# Patient Record
Sex: Male | Born: 1955 | Race: White | Hispanic: No | Smoking: Never smoker
Health system: Southern US, Community
[De-identification: ages and names within clinical notes are randomized; demographics above are authoritative.]

## PROBLEM LIST (undated history)

## (undated) DIAGNOSIS — F32A Depression, unspecified: Secondary | ICD-10-CM

## (undated) DIAGNOSIS — F419 Anxiety disorder, unspecified: Secondary | ICD-10-CM

## (undated) DIAGNOSIS — R569 Unspecified convulsions: Secondary | ICD-10-CM

## (undated) DIAGNOSIS — G4700 Insomnia, unspecified: Secondary | ICD-10-CM

## (undated) DIAGNOSIS — F329 Major depressive disorder, single episode, unspecified: Secondary | ICD-10-CM

## (undated) DIAGNOSIS — Z9889 Other specified postprocedural states: Secondary | ICD-10-CM

## (undated) DIAGNOSIS — R112 Nausea with vomiting, unspecified: Secondary | ICD-10-CM

## (undated) DIAGNOSIS — I10 Essential (primary) hypertension: Secondary | ICD-10-CM

## (undated) DIAGNOSIS — S0990XA Unspecified injury of head, initial encounter: Secondary | ICD-10-CM

## (undated) HISTORY — PX: TRACHEOSTOMY: SUR1362

## (undated) HISTORY — PX: FRACTURE SURGERY: SHX138

## (undated) HISTORY — PX: OTHER SURGICAL HISTORY: SHX169

## (undated) HISTORY — PX: BACK SURGERY: SHX140

## (undated) HISTORY — PX: VASECTOMY: SHX75

## (undated) HISTORY — PX: FACIAL FRACTURE SURGERY: SHX1570

---

## 2001-04-20 ENCOUNTER — Emergency Department (HOSPITAL_COMMUNITY): Admission: EM | Admit: 2001-04-20 | Discharge: 2001-04-20 | Payer: Self-pay

## 2002-10-14 ENCOUNTER — Emergency Department (HOSPITAL_COMMUNITY): Admission: EM | Admit: 2002-10-14 | Discharge: 2002-10-14 | Payer: Self-pay | Admitting: Emergency Medicine

## 2005-04-20 ENCOUNTER — Encounter: Admission: RE | Admit: 2005-04-20 | Discharge: 2005-04-20 | Payer: Self-pay | Admitting: Family Medicine

## 2005-05-11 ENCOUNTER — Encounter: Admission: RE | Admit: 2005-05-11 | Discharge: 2005-05-11 | Payer: Self-pay | Admitting: Family Medicine

## 2005-07-17 ENCOUNTER — Emergency Department (HOSPITAL_COMMUNITY): Admission: EM | Admit: 2005-07-17 | Discharge: 2005-07-17 | Payer: Self-pay | Admitting: Emergency Medicine

## 2005-07-19 ENCOUNTER — Encounter: Admission: RE | Admit: 2005-07-19 | Discharge: 2005-07-19 | Payer: Self-pay | Admitting: Internal Medicine

## 2007-01-05 ENCOUNTER — Encounter (INDEPENDENT_AMBULATORY_CARE_PROVIDER_SITE_OTHER): Payer: Self-pay | Admitting: *Deleted

## 2007-01-05 ENCOUNTER — Ambulatory Visit (HOSPITAL_BASED_OUTPATIENT_CLINIC_OR_DEPARTMENT_OTHER): Admission: RE | Admit: 2007-01-05 | Discharge: 2007-01-05 | Payer: Self-pay | Admitting: Urology

## 2007-04-06 ENCOUNTER — Encounter (INDEPENDENT_AMBULATORY_CARE_PROVIDER_SITE_OTHER): Payer: Self-pay | Admitting: Urology

## 2007-04-06 ENCOUNTER — Ambulatory Visit (HOSPITAL_COMMUNITY): Admission: RE | Admit: 2007-04-06 | Discharge: 2007-04-06 | Payer: Self-pay | Admitting: Urology

## 2009-03-23 ENCOUNTER — Observation Stay (HOSPITAL_COMMUNITY): Admission: AD | Admit: 2009-03-23 | Discharge: 2009-03-24 | Payer: Self-pay | Admitting: Internal Medicine

## 2009-06-17 ENCOUNTER — Emergency Department (HOSPITAL_COMMUNITY): Admission: EM | Admit: 2009-06-17 | Discharge: 2009-06-17 | Payer: Self-pay | Admitting: Emergency Medicine

## 2009-11-26 ENCOUNTER — Encounter: Admission: RE | Admit: 2009-11-26 | Discharge: 2009-11-26 | Payer: Self-pay | Admitting: Internal Medicine

## 2011-01-17 LAB — COMPREHENSIVE METABOLIC PANEL
ALT: 16 U/L (ref 0–53)
AST: 21 U/L (ref 0–37)
Albumin: 4.1 g/dL (ref 3.5–5.2)
Alkaline Phosphatase: 175 U/L — ABNORMAL HIGH (ref 39–117)
BUN: 22 mg/dL (ref 6–23)
CO2: 29 mEq/L (ref 19–32)
Calcium: 8.9 mg/dL (ref 8.4–10.5)
Chloride: 105 mEq/L (ref 96–112)
Creatinine, Ser: 0.77 mg/dL (ref 0.4–1.5)
GFR calc Af Amer: >60 mL/min (ref >60)
GFR calc non Af Amer: >60 mL/min (ref >60)
Glucose, Bld: 120 mg/dL — ABNORMAL HIGH (ref 70–99)
Potassium: 3.7 mEq/L (ref 3.5–5.1)
Sodium: 143 mEq/L (ref 135–145)
Total Bilirubin: 0.7 mg/dL (ref 0.3–1.2)
Total Protein: 6.5 g/dL (ref 6.0–8.3)

## 2011-01-17 LAB — CBC
HCT: 37.4 % — ABNORMAL LOW (ref 39.0–52.0)
Hemoglobin: 12.2 g/dL — ABNORMAL LOW (ref 13.0–17.0)
MCHC: 32.6 g/dL (ref 30.0–36.0)
MCV: 77.3 fL — ABNORMAL LOW (ref 78.0–100.0)
Platelets: 187 10*3/uL (ref 150–400)
RBC: 4.84 MIL/uL (ref 4.22–5.81)
RDW: 15.4 % (ref 11.5–15.5)
WBC: 5.7 10*3/uL (ref 4.0–10.5)

## 2011-01-17 LAB — PHENYTOIN LEVEL, TOTAL: Phenytoin Lvl: 7.9 ug/mL — ABNORMAL LOW (ref 10.0–20.0)

## 2011-02-22 NOTE — Op Note (Signed)
NAMEELIC, Richard Hines          ACCOUNT NO.:  [id number]   MEDICAL RECORD NO.:  [id number]          PATIENT TYPE:  AMB   LOCATION:  DAY                          FACILITY:  [facility]   PHYSICIAN:  Sigmund I. Patsi Sears, M.D.DATE OF BIRTH:  05-05-1956   DATE OF PROCEDURE:  04/06/2007  DATE OF DISCHARGE:                               OPERATIVE REPORT   PREOPERATIVE DIAGNOSIS:  Left epididymal cyst, left testicular pain.   POSTOPERATIVE DIAGNOSIS:  Left epididymal cyst, left testicular pain.   PROCEDURE:  Left epididymectomy.   SURGEON:  Sigmund I. Patsi Sears, M.D.   RESIDENT:  Terie Purser, M.D.   ANESTHESIA:  General.   COMPLICATIONS:  None   DISPOSITION:  Stable to the post anesthesia care unit.   SPECIMENS:  Left epididymis for pathologic analysis.   INDICATIONS FOR PROCEDURE:  [name] is a 55 year old gentleman who  has a history of a left sided varicocele.  He has developed left sided  testicular and groin pain.  He has a cyst located on the proximal  portion of the epididymis which is tender to palpation.  He was  counseled regarding benefits and risks of proceeding with a left  epididymectomy for evaluation of the cyst and possible pain control.  The risks included persistence of pain.  He understands these risks and  has agreed to proceed.   DESCRIPTION OF PROCEDURE:  The patient was brought to the operating room  and was properly identified.  A time out was performed to confirm the  correct patient, procedure, and side.  He was administered general  anesthesia and given preoperative antibiotics.  He was placed in the  supine position on the operating table and prepped and draped in the  usual sterile fashion.  On examination, he did have a palpable cystic  structure in the head of the left epididymis.  We made an approximate 4  cm transverse incision in the left hemiscrotum.  Dissection was carried  down through the dartos layer and subcutaneous tissues to expose  the  left testicle.  This was delivered into the operative field.  The tunica  vaginalis was stuck in several areas to the testicle and the epididymis  had areas which appeared to be chronic inflammation.  The cyst was  readily palpable and did not appear to be involving the testis.  The  testis appeared to be free of any abnormality.   We, therefore, proceeded with epididymectomy.  A combination of sharp  dissection and Bovie cautery was used to develop a plane between the  epididymis and testicle.  The epididymis was removed in its entirety.  There was a small opening in the tunica albuginea on the testis at the  insertion site of the epididymis.  This was closed in a running fashion  with 4-0 PDS.  The epididymis was dissected free.  The vas was  identified and the spermatic cord.  This was isolated, divided, and tied  with a 3-0 Vicryl suture.  The vas was dissected down to the epididymis  and the specimen was removed and sent for pathologic analysis.  Hemostasis was ensured by using the Bovie  cautery.  The testis appeared  viable.  We then copiously irrigated the wound.  A cord block with 0.25%  Marcaine was placed.  The testis was then delivered into the hemiscrotum  and we closed the wound.   The dartos layer was closed in a running fashion with 3-0 Vicryl suture.  The skin was closed in a running fashion with 4-0 Vicryl suture.  Dermabond was applied.  Scrotal support and fluff gauze was placed.  The  skin had been infiltrated with 0.25% plain Marcaine.  There were no  complications.  All sponge and needle counts were correct at the end of  the case.  Please note Dr. Patsi Sears was present and participated in  all aspects of this procedure.  The patient was then awakened from  anesthesia and transferred to the recovery room in stable condition.     ______________________________  Terie Purser, MD      Sigmund I. Patsi Sears, M.D.  Electronically Signed    JH/MEDQ  D:   04/06/2007  T:  04/07/2007  Job:  [id number]

## 2011-02-25 NOTE — Op Note (Signed)
Richard Hines, TORTORELLA          ACCOUNT NO.:  [id number]   MEDICAL RECORD NO.:  [id number]          PATIENT TYPE:  AMB   LOCATION:  [facility]                         FACILITY:  [facility]   PHYSICIAN:  Cornelious Bryant, MD     DATE OF BIRTH:  Apr 25, 1956   DATE OF PROCEDURE:  01/05/2007  DATE OF DISCHARGE:                               OPERATIVE REPORT   ATTENDING PHYSICIAN:  Dr. Jethro Bolus.   PREOP DIAGNOSIS:  Left varicocele.   POSTOP DIAGNOSIS:  Left varicocele.   PROCEDURES PERFORMED:  Left internal spermatic vein complex ligation.   SURGEON:  Dr. Patsi Sears.   ANESTHESIA:  General.   SPECIMEN:  The left spermatic vein complex segment sent for pathological  analysis.   ESTIMATED BLOOD LOSS:  Minimal.   COMPLICATIONS:  None.   ASSISTANT:  Cornelious Bryant, MD   INDICATIONS:  This is an 55 year old gentleman who presented to Dr.  Patsi Sears with scrotal and testicular pain.  The patient did have a  left varicocele.  After extensive counseling of the patient he elected  for varicocele ligation.   DESCRIPTION OF PROCEDURE:  The patient had preoperative antibiotics.  He  was brought to the operating room.  General anesthesia was induced.  Time-out was taken to properly identify the patient, site and procedure  going to be done.  The patient was placed in the supine position.  He  had bilateral SCDs applied to prevent DVTs.  Pressure points were padded  adequately.  His left inguinal and left lower quadrant area was clipped,  prepped and draped in the normal sterile fashion.  A 5-cm oblique  incision was made just over the external inguinal ring.  Dissection was  then carried down through fascial layers to the level of the inguinal  ring.  Scissors were then applied to the external inguinal ring and the  external oblique aponeurotic shelf was then split using scissors, making  sure that the ilioinguinal nerve was very well protected.  A right angle  was then used isolate  the spermatic cord and a Penrose drain was then  applied beneath the spermatic cord, thus tenting it upward.  Care was  given to make sure that the ilioinguinal nerve was not included in our  field.  The fascial attachments around the spermatic cord were then  incised and the vas deferens was easily identified and protected.  The  testicular artery was also identified by palpation and protected.  Three  large spermatic vein complexes were identified and were isolated en  block and were doubly ligated on the proximal and distal part and about  a 2-cm segment of the complex was cut and sent for pathological  evaluation.  The spermatic cord was then copiously injected with  Marcaine solution.  The external inguinal ring was then reconstituted  using 3-0 Vicryl in an interrupted fashion that were applied in the  external oblique aponeurosis with the ilioinguinal nerve identified and  protected from the suture lines.  The subcutaneous tissue was loosely  approximated using 3-0 Vicryl in a running fashion.  The skin was then  closed using 4-0 Monocryl in a  running fashion in a subcuticular stitch.  Benzoin and Steri-Strips were applied.  The procedure terminated with no  complications and the patient was extubated in the operating room and  taken in stable condition to PACU.   Please note that Dr. Patsi Sears was present and participated in the  entire procedure as he was the responsible surgeon.           ______________________________  Cornelious Bryant, MD     SK/MEDQ  D:  01/05/2007  T:  01/05/2007  Job:  [id number]   cc:   Lynelle Smoke I. Patsi Sears, M.D.  Fax: [phone]

## 2011-04-05 ENCOUNTER — Other Ambulatory Visit: Payer: Self-pay | Admitting: Internal Medicine

## 2011-04-05 ENCOUNTER — Ambulatory Visit
Admission: RE | Admit: 2011-04-05 | Discharge: 2011-04-05 | Disposition: A | Discharge status: Home / Self Care | Payer: Managed Care, Other (non HMO) | Source: Ambulatory Visit | Attending: Internal Medicine | Admitting: Internal Medicine

## 2011-04-05 DIAGNOSIS — R51 Headache: Secondary | ICD-10-CM

## 2011-04-05 DIAGNOSIS — R42 Dizziness and giddiness: Secondary | ICD-10-CM

## 2011-07-27 LAB — BASIC METABOLIC PANEL
BUN: 15
CO2: 31
Calcium: 8.5
Chloride: 106
Creatinine, Ser: 0.91
GFR calc Af Amer: >60
GFR calc non Af Amer: >60
Glucose, Bld: 91
Potassium: 3.8
Sodium: 143

## 2011-07-27 LAB — CBC
HCT: 38.3 — ABNORMAL LOW
Hemoglobin: 13.5
MCHC: 35.4
MCV: 85.1
Platelets: 187
RBC: 4.49
RDW: 13.7
WBC: 5.1

## 2013-01-27 ENCOUNTER — Emergency Department (HOSPITAL_COMMUNITY)
Admission: EM | Admit: 2013-01-27 | Discharge: 2013-01-27 | Disposition: A | Discharge status: Home / Self Care | Payer: Managed Care, Other (non HMO) | Attending: Emergency Medicine | Admitting: Emergency Medicine

## 2013-01-27 ENCOUNTER — Encounter (HOSPITAL_COMMUNITY): Payer: Self-pay | Admitting: Emergency Medicine

## 2013-01-27 ENCOUNTER — Emergency Department (HOSPITAL_COMMUNITY): Payer: Managed Care, Other (non HMO)

## 2013-01-27 DIAGNOSIS — W010XXA Fall on same level from slipping, tripping and stumbling without subsequent striking against object, initial encounter: Secondary | ICD-10-CM | POA: Insufficient documentation

## 2013-01-27 DIAGNOSIS — Z79899 Other long term (current) drug therapy: Secondary | ICD-10-CM | POA: Insufficient documentation

## 2013-01-27 DIAGNOSIS — Y9389 Activity, other specified: Secondary | ICD-10-CM | POA: Insufficient documentation

## 2013-01-27 DIAGNOSIS — IMO0002 Reserved for concepts with insufficient information to code with codable children: Secondary | ICD-10-CM | POA: Insufficient documentation

## 2013-01-27 DIAGNOSIS — W1809XA Striking against other object with subsequent fall, initial encounter: Secondary | ICD-10-CM | POA: Insufficient documentation

## 2013-01-27 DIAGNOSIS — S8392XA Sprain of unspecified site of left knee, initial encounter: Secondary | ICD-10-CM

## 2013-01-27 DIAGNOSIS — Y9289 Other specified places as the place of occurrence of the external cause: Secondary | ICD-10-CM | POA: Insufficient documentation

## 2013-01-27 MED ORDER — HYDROCODONE-ACETAMINOPHEN 5-325 MG PO TABS: 2.0000 | ORAL_TABLET | ORAL | Status: DC | PRN

## 2013-01-27 NOTE — ED Notes (Signed)
Pt reports taking Aleve at 1900 and icing knee.

## 2013-01-27 NOTE — ED Provider Notes (Signed)
History    This chart was scribed for a non-physician practitioner, Wynetta Emery, working with Glynn Octave, MD by Frederik Pear, ED Scribe. This patient was seen in room WTR9/WTR9 and the patient's care was started at 2147.   CSN: [id number]  Arrival date & time 01/27/13  2115   First MD Initiated Contact with Patient 01/27/13 2147      Chief Complaint  Patient presents with  . Knee Pain    (Consider location/radiation/quality/duration/timing/severity/associated sxs/prior treatment) The history is provided by the patient and medical records. No language interpreter was used.    Richard Hines is a 57 y.o. male who presents to the Emergency Department complaining of sudden onset, constant, non-radiating left knee pain that is aggravated by bearing weight, standing, and extending the knee and alleviated by nothing with an associated abrasion that began at 1630 when he fell forward from a height of about 6 inches above the ground onto a metal platform associated with his lawn mower. In ED, the bleeding is controlled. He states that the platform moved unexpectedly and when he fell he bore the weight of the fall on his left hand and left knee. He states that he was unable to ambulate for 5 minutes following the fall. He reports that he treated the pain with Aleve at 1900. He states that his last tetanus shot was 3-4 years ago.   No past medical history on file.  No past surgical history on file.  History reviewed. No pertinent family history.  History  Substance Use Topics  . Smoking status: Not on file  . Smokeless tobacco: Not on file  . Alcohol Use: Not on file      Review of Systems  Constitutional: Negative for fever.  Respiratory: Negative for shortness of breath.   Cardiovascular: Negative for chest pain.  Gastrointestinal: Negative for nausea, vomiting, abdominal pain and diarrhea.  All other systems reviewed and are negative.    Allergies  Review of  patient's allergies indicates no known allergies.  Home Medications   Current Outpatient Rx  Name  Route  Sig  Dispense  Refill  . citalopram (CELEXA) 10 MG tablet   Oral   Take 10 mg by mouth daily.         . naproxen sodium (ANAPROX) 220 MG tablet   Oral   Take 220 mg by mouth 2 (two) times daily as needed (pain).           BP 124/86  Pulse 68  Temp(Src) 98.7 F (37.1 C) (Oral)  Resp 16  Ht 5\' 9"  (1.753 m)  Wt 193 lb (87.544 kg)  BMI 28.49 kg/m2  SpO2 100%  Physical Exam  Nursing note and vitals reviewed. Constitutional: He is oriented to person, place, and time. He appears well-developed and well-nourished. No distress.  HENT:  Head: Normocephalic.  Eyes: Conjunctivae and EOM are normal.  Cardiovascular: Normal rate.   Pulmonary/Chest: Effort normal. No stridor.  Musculoskeletal: Normal range of motion.  No deformity, erythema or abrasions. FROM. No effusion or crepitance. Anterior and posterior drawer show no abnormal laxity. Stable to valgus and varus stress. Joint lines are non-tender. Neurovascularly intact. Pt ambulates with non-antalgic gait.   Neurological: He is alert and oriented to person, place, and time.  Skin:  There is a bruise to the palm of the left hand.  Psychiatric: He has a normal mood and affect.    ED Course  Procedures (including critical care time)  DIAGNOSTIC STUDIES: Oxygen Saturation is  100% on room air, normal by my interpretation.    COORDINATION OF CARE:  22:45- Discussed planned course of treatment with the patient, including crutches, a knee immobilizer, Vicodin, and following up with an orthopedist, who is agreeable at this time. At this time, he refused a CT for further clinical evaluation.  Labs Reviewed - No data to display Dg Knee Complete 4 Views Left  01/27/2013  *RADIOLOGY REPORT*  Clinical Data: Trauma and pain.  LEFT KNEE - COMPLETE 4+ VIEW  Comparison: None.  Findings: Small suprapatellar joint effusion.   Suggestion of soft tissue swelling about the patellar tendon.  Subtle irregularity of the patella about its medial inferior portion.  Only readily apparent on oblique images.  IMPRESSION: Suprapatellar joint effusion.  Suggestion of edema about the patellar tendon.  Suspicion of a minimally displaced patellar fracture.  Correlate with point tenderness.  If this is a clinical concern, consider CT.   Original Report Authenticated By: Jeronimo Greaves, M.D.    Medications - No data to display  1. Knee sprain and strain, left, initial encounter      MDM   Richard Hines is a 57 y.o. male with left knee pain status post slip and fall. Knee exam shows no significant abnormalities. X-ray is read as suspicion of a minimally displaced patellar fracture and he recommends CT to further evaluate, however the patient declined this. We'll put him in a knee immobilizer given crutches and he has an orthopedist that he follows with. Advised him not to weight bear until he is cleared by the orthopedist  Filed Vitals:   01/27/13 2239  BP: 124/86  Pulse: 68  Temp: 98.7 F (37.1 C)  TempSrc: Oral  Resp: 16  Height: 5\' 9"  (1.753 m)  Weight: 193 lb (87.544 kg)  SpO2: 100%     Pt verbalized understanding and agrees with care plan. Outpatient follow-up and return precautions given.    Discharge Medication List as of 01/27/2013 10:52 PM    START taking these medications   Details  HYDROcodone-acetaminophen (NORCO/VICODIN) 5-325 MG per tablet Take 2 tablets by mouth every 4 (four) hours as needed for pain., Starting 01/27/2013, Until Discontinued, Print        I personally performed the services described in this documentation, which was scribed in my presence. The recorded information has been reviewed and is accurate.       Wynetta Emery, PA-C 01/29/13 [phone]

## 2013-01-27 NOTE — ED Notes (Signed)
Pt states he fell around 1630 and hit his knee and was unable to walk at that time.  Pt stated he is still unable to walk at this time.

## 2013-01-29 NOTE — ED Provider Notes (Signed)
Medical screening examination/treatment/procedure(s) were performed by non-physician practitioner and as supervising physician I was immediately available for consultation/collaboration.  Nicki Gracy, MD 01/29/13 1100 

## 2016-05-08 ENCOUNTER — Encounter (HOSPITAL_COMMUNITY): Payer: Self-pay

## 2016-05-08 ENCOUNTER — Emergency Department (HOSPITAL_COMMUNITY)
Admission: EM | Admit: 2016-05-08 | Discharge: 2016-05-08 | Disposition: A | Discharge status: Home / Self Care | Payer: Managed Care, Other (non HMO) | Attending: Emergency Medicine | Admitting: Emergency Medicine

## 2016-05-08 ENCOUNTER — Emergency Department (HOSPITAL_COMMUNITY): Payer: Managed Care, Other (non HMO)

## 2016-05-08 DIAGNOSIS — Y9389 Activity, other specified: Secondary | ICD-10-CM | POA: Diagnosis not present

## 2016-05-08 DIAGNOSIS — I10 Essential (primary) hypertension: Secondary | ICD-10-CM | POA: Insufficient documentation

## 2016-05-08 DIAGNOSIS — Y9289 Other specified places as the place of occurrence of the external cause: Secondary | ICD-10-CM | POA: Insufficient documentation

## 2016-05-08 DIAGNOSIS — S82202A Unspecified fracture of shaft of left tibia, initial encounter for closed fracture: Secondary | ICD-10-CM

## 2016-05-08 DIAGNOSIS — Y999 Unspecified external cause status: Secondary | ICD-10-CM | POA: Insufficient documentation

## 2016-05-08 DIAGNOSIS — X501XXA Overexertion from prolonged static or awkward postures, initial encounter: Secondary | ICD-10-CM | POA: Diagnosis not present

## 2016-05-08 DIAGNOSIS — S8252XA Displaced fracture of medial malleolus of left tibia, initial encounter for closed fracture: Secondary | ICD-10-CM | POA: Diagnosis not present

## 2016-05-08 DIAGNOSIS — S8992XA Unspecified injury of left lower leg, initial encounter: Secondary | ICD-10-CM | POA: Diagnosis present

## 2016-05-08 HISTORY — DX: Essential (primary) hypertension: I10

## 2016-05-08 HISTORY — DX: Insomnia, unspecified: G47.00

## 2016-05-08 MED ORDER — HYDROCODONE-ACETAMINOPHEN 5-325 MG PO TABS
1.0000 | ORAL_TABLET | Freq: Once | ORAL | Status: AC
  Administered 2016-05-08: 1 via ORAL
  Filled 2016-05-08: qty 1

## 2016-05-08 MED ORDER — NAPROXEN 500 MG PO TABS: 500.0000 mg | ORAL_TABLET | Freq: Two times a day (BID) | ORAL | 0 refills | Status: DC

## 2016-05-08 MED ORDER — HYDROCODONE-ACETAMINOPHEN 5-325 MG PO TABS
1.0000 | ORAL_TABLET | ORAL | 0 refills | Status: DC | PRN
Start: 2016-05-08 — End: 2016-05-15

## 2016-05-08 NOTE — Discharge Instructions (Signed)
You have been seen today for an ankle injury. There is a fracture on the inside of your left ankle. Follow-up with orthopedics as soon as possible. Call the number provided to set up an appointment. Expect your soreness to increase over the next 2-3 days. Take it easy, but do not lay around too much as this may make the stiffness worse. Take 500 mg of naproxen every 12 hours or 800 mg of ibuprofen every 8 hours for the next 3 days, and then as needed thereafter. Take these medications with food to avoid upset stomach. Vicodin for severe pain. Do not take the  Vicodin while driving or performing other dangerous activities.

## 2016-05-08 NOTE — ED Triage Notes (Signed)
Pt fell stumbled coming off porch. Landed on left ankle and felt a pop.  Some swelling noted.

## 2016-05-08 NOTE — ED Notes (Signed)
PT DISCHARGED. INSTRUCTIONS AND PRESCRIPTIONS GIVEN. AAOX4. PT IN NO APPARENT DISTRESS. THE OPPORTUNITY TO ASK QUESTIONS WAS PROVIDED. 

## 2016-05-08 NOTE — ED Provider Notes (Signed)
[facility] DEPT Provider Note   CSN: [id number] Arrival date & time: 05/08/16  1712  First Provider Contact:  First MD Initiated Contact with Patient 05/08/16 1908       By signing my name below, I, Richard Hines, attest that this documentation has been prepared under the direction and in the presence of Richard Kelemen, PA-C. Electronically Signed: Hansel Hines, ED Scribe. 05/08/16. 7:23 PM.   History   Chief Complaint Chief Complaint  Patient presents with  . Ankle Pain    HPI Richard Hines is a 60 y.o. male who presents to the Emergency Department complaining of moderate left ankle pain s/p injury that occurred today. Pt states he stumbled off a porch and twisted his left ankle, causing his injury. Pt also reports he heard a "pop" upon landing on his ankle. He denies fall, LOC or head injury. 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 Pt states that pain is worsened with movement and weight-bearing. No alleviating factors noted. He denies numbness, weakness, additional injuries.     The history is provided by the patient. No language interpreter was used.    Past Medical History:  Diagnosis Date  . Hypertension   . Insomnia     There are no active problems to display for this patient.   Past Surgical History:  Procedure Laterality Date  . BACK SURGERY    . FACIAL FRACTURE SURGERY    . TRACHEOSTOMY         Home Medications    Prior to Admission medications   Medication Sig Start Date End Date Taking? Authorizing Provider  citalopram (CELEXA) 10 MG tablet Take 10 mg by mouth daily.    Historical Provider, MD  HYDROcodone-acetaminophen (NORCO/VICODIN) 5-325 MG tablet Take 1-2 tablets by mouth every 4 (four) hours as needed for severe pain. 05/08/16   Richard Renault C O[place] Kinyon, PA-C  naproxen (NAPROSYN) 500 MG tablet Take 1 tablet (500 mg total) by mouth 2 (two) times daily.  05/08/16   Richard Middlesworth C Karman Biswell, PA-C  naproxen sodium (ANAPROX) 220 MG tablet Take 220 mg by mouth 2 (two) times daily as needed (pain).    Historical Provider, MD    Family History History reviewed. No pertinent family history.  Social History Social History  Substance Use Topics  . Smoking status: Never Smoker  . Smokeless tobacco: Never Used  . Alcohol use No     Allergies   Review of patient's allergies indicates no known allergies.   Review of Systems Review of Systems  Musculoskeletal: Positive for arthralgias (left ankle).  Neurological: Negative for weakness and numbness.     Physical Exam Updated Vital Signs BP 133/90 (BP Location: Left Arm)   Pulse 78   Temp 98.2 F (36.8 C) (Oral)   Resp 17   Ht 5\' 9"  (1.753 m)   Wt 190 lb (86.2 kg)   SpO2 100%   BMI 28.06 kg/m   Physical Exam  Constitutional: He appears well-developed and well-nourished.  HENT:  Head: Normocephalic.  Eyes: Conjunctivae are normal.  Cardiovascular: Normal rate and intact distal pulses.   Distal circulation is intact.   Pulmonary/Chest: Effort normal. No respiratory distress.  Abdominal: He exhibits no distension.  Musculoskeletal: Normal range of motion. He exhibits edema and tenderness. He exhibits no deformity.  Tenderness and swelling of the left medial malleolus. Plantar flexion intact. Dorsiflexion is  chronically absent.   Neurological: He is alert.  No sensory deficits, Strength in the toes is 5/5.   Skin: Skin is warm and dry.  Psychiatric: He has a normal mood and affect. His behavior is normal.  Nursing note and vitals reviewed.    ED Treatments / Results  Labs (all labs ordered are listed, but only abnormal results are displayed) Labs Reviewed - No data to display  EKG  EKG Interpretation None       Radiology Dg Ankle Complete Left  Result Date: 05/08/2016 CLINICAL DATA:  [place] today and injured left ankle. EXAM: LEFT ANKLE COMPLETE - 3+ VIEW COMPARISON:  None.  FINDINGS: The ankle mortise is maintained. There is a mildly displaced transverse fracture through the medial malleolus at the level of the ankle joint. No fibular fractures identified. No obvious joint effusion. The mid and hindfoot bony structures are intact. IMPRESSION: Isolated transverse fracture of the medial malleolus. Electronically Signed   By: Richard Hines M.D.   On: 05/08/2016 18:30   Procedures .Splint Application Date/Time: 0000000 9:12 PM Performed by: Richard Hines Authorized by: Richard Hines   Consent:    Consent obtained:  Verbal   Consent given by:  Patient   Risks discussed:  Numbness, discoloration, pain and swelling   Alternatives discussed:  No treatment and referral Pre-procedure details:    Sensation:  Normal   Skin color:  Normal Procedure details:    Laterality:  Left   Location:  Ankle   Ankle:  L ankle   Cast type:  Short leg   Splint type:  Ankle stirrup and short leg   Supplies:  Plaster Post-procedure details:    Pain:  Unchanged   Sensation:  Normal   Skin color:  Normal   Patient tolerance of procedure:  Tolerated well, no immediate complications Comments:     Procedure was performed by the Ortho Tech with my evaluation before and after. I was available for consultation throughout the procedure.   (including critical care time)  DIAGNOSTIC STUDIES: Oxygen Saturation is 100% on RA, normal by my interpretation.    COORDINATION OF CARE: 7:20 PM Discussed treatment plan with pt at bedside which includes XR, splint, orthopedic f/u and pt agreed to plan.    Medications Ordered in ED Medications  HYDROcodone-acetaminophen (NORCO/VICODIN) 5-325 MG per tablet 1 tablet (1 tablet Oral Given 05/08/16 1940)     Initial Impression / Assessment and Plan / ED Course  I have reviewed the triage vital signs and the nursing notes.  Pertinent labs & imaging results that were available during my care of the patient were reviewed by me and considered in  my medical decision making (see chart for details).  Clinical Course    Richard Hines presents with left ankle injury that occurred prior to arrival.  Medial malleolus fracture of the left ankle. Splint applied. Orthopedic follow-up. The patient was given instructions for home care as well as return precautions. Patient voices understanding of these instructions, accepts the plan, and is comfortable with discharge.   Vitals:   05/08/16 1938 05/08/16 2141  BP: 133/90 127/82  Pulse: 78 72  Resp: 17 16  Temp: 98.2 F (36.8 C)   TempSrc: Oral   SpO2: 100% 100%  Weight: 86.2 kg   Height: 5\' 9"  (1.753 m)      Final Clinical Impressions(s) / ED Diagnoses   Final diagnoses:  Tibial fracture, left, closed, initial encounter    New Prescriptions New Prescriptions  HYDROCODONE-ACETAMINOPHEN (NORCO/VICODIN) 5-325 MG TABLET    Take 1-2 tablets by mouth every 4 (four) hours as needed for severe pain.   NAPROXEN (NAPROSYN) 500 MG TABLET    Take 1 tablet (500 mg total) by mouth 2 (two) times daily.    I personally performed the services described in this documentation, which was scribed in my presence. The recorded information has been reviewed and is accurate.    Richard Bender, PA-C 05/08/16 2149    Quintella Reichert, MD 05/09/16 [phone]

## 2016-05-10 ENCOUNTER — Other Ambulatory Visit: Payer: Self-pay | Admitting: Physician Assistant

## 2016-05-10 NOTE — Patient Instructions (Signed)
DRAGAN JAUREQUI  05/10/2016   Your procedure is scheduled on: 05/13/2016   Report to [facility]  Entrance take [facility]  elevators to 3rd floor to  [place] at   1230pm.  Call this number if you have problems the morning of surgery [phone]   Remember: ONLY 1 PERSON MAY GO WITH YOU TO SHORT STAY TO GET  READY MORNING OF YOUR SURGERY.  Do not eat food after midnite.  May have clear liquids from 12 midnite until 0730am morning of surgery then nothing by mouth.       Take these medicines the morning of surgery with A SIP OF WATER:celexa, Hydrocodone if needed                                 You may not have any metal on your body including hair pins and              piercings  Do not wear jewelry,  lotions, powders or perfumes, deodorant                         Men may shave face and neck.   Do not bring valuables to the hospital. [place].  Contacts, dentures or bridgework may not be worn into surgery.  Leave suitcase in the car. After surgery it may be brought to your room.       Special Instructions: coughing and deep breathing exercises, leg exercises               Please read over the following fact sheets you were given: _____________________________________________________________________             [facility] - Preparing for Surgery Before surgery, you can play an important role.  Because skin is not sterile, your skin needs to be as free of germs as possible.  You can reduce the number of germs on your skin by washing with CHG (chlorahexidine gluconate) soap before surgery.  CHG is an antiseptic cleaner which kills germs and bonds with the skin to continue killing germs even after washing. Please DO NOT use if you have an allergy to CHG or antibacterial soaps.  If your skin becomes reddened/irritated stop using the CHG and inform your nurse when you arrive at Short Stay. Do not  shave (including legs and underarms) for at least 48 hours prior to the first CHG shower.  You may shave your face/neck. Please follow these instructions carefully:  1.  Shower with CHG Soap the night before surgery and the  morning of Surgery.  2.  If you choose to wash your hair, wash your hair first as usual with your  normal  shampoo.  3.  After you shampoo, rinse your hair and body thoroughly to remove the  shampoo.                           4.  Use CHG as you would any other liquid soap.  You can apply chg directly  to the skin and wash  Gently with a scrungie or clean washcloth.  5.  Apply the CHG Soap to your body ONLY FROM THE NECK DOWN.   Do not use on face/ open                           Wound or open sores. Avoid contact with eyes, ears mouth and genitals (private parts).                       Wash face,  Genitals (private parts) with your normal soap.             6.  Wash thoroughly, paying special attention to the area where your surgery  will be performed.  7.  Thoroughly rinse your body with warm water from the neck down.  8.  DO NOT shower/wash with your normal soap after using and rinsing off  the CHG Soap.                9.  Pat yourself dry with a clean towel.            10.  Wear clean pajamas.            11.  Place clean sheets on your bed the night of your first shower and do not  sleep with pets. Day of Surgery : Do not apply any lotions/deodorants the morning of surgery.  Please wear clean clothes to the hospital/surgery center.  FAILURE TO FOLLOW THESE INSTRUCTIONS MAY RESULT IN THE CANCELLATION OF YOUR SURGERY PATIENT SIGNATURE_________________________________  NURSE SIGNATURE__________________________________  ________________________________________________________________________   Adam Phenix  An incentive spirometer is a tool that can help keep your lungs clear and active. This tool measures how well you are filling your lungs  with each breath. Taking long deep breaths may help reverse or decrease the chance of developing breathing (pulmonary) problems (especially infection) following:  A long period of time when you are unable to move or be active. BEFORE THE PROCEDURE   If the spirometer includes an indicator to show your best effort, your nurse or respiratory therapist will set it to a desired goal.  If possible, sit up straight or lean slightly forward. Try not to slouch.  Hold the incentive spirometer in an upright position. INSTRUCTIONS FOR USE  1. Sit on the edge of your bed if possible, or sit up as far as you can in bed or on a chair. 2. Hold the incentive spirometer in an upright position. 3. Breathe out normally. 4. Place the mouthpiece in your mouth and seal your lips tightly around it. 5. Breathe in slowly and as deeply as possible, raising the piston or the ball toward the top of the column. 6. Hold your breath for 3-5 seconds or for as long as possible. Allow the piston or ball to fall to the bottom of the column. 7. Remove the mouthpiece from your mouth and breathe out normally. 8. Rest for a few seconds and repeat Steps 1 through 7 at least 10 times every 1-2 hours when you are awake. Take your time and take a few normal breaths between deep breaths. 9. The spirometer may include an indicator to show your best effort. Use the indicator as a goal to work toward during each repetition. 10. After each set of 10 deep breaths, practice coughing to be sure your lungs are clear. If you have an incision (the cut made at the time of surgery),  support your incision when coughing by placing a pillow or rolled up towels firmly against it. Once you are able to get out of bed, walk around indoors and cough well. You may stop using the incentive spirometer when instructed by your caregiver.  RISKS AND COMPLICATIONS  Take your time so you do not get dizzy or light-headed.  If you are in pain, you may need to  take or ask for pain medication before doing incentive spirometry. It is harder to take a deep breath if you are having pain. AFTER USE  Rest and breathe slowly and easily.  It can be helpful to keep track of a log of your progress. Your caregiver can provide you with a simple table to help with this. If you are using the spirometer at home, follow these instructions: [place] IF:   You are having difficultly using the spirometer.  You have trouble using the spirometer as often as instructed.  Your pain medication is not giving enough relief while using the spirometer.  You develop fever of 100.5 F (38.1 C) or higher. SEEK IMMEDIATE MEDICAL CARE IF:   You cough up bloody sputum that had not been present before.  You develop fever of 102 F (38.9 C) or greater.  You develop worsening pain at or near the incision site. MAKE SURE YOU:   Understand these instructions.  Will watch your condition.  Will get help right away if you are not doing well or get worse. Document Released: 02/06/2007 Document Revised: 12/19/2011 Document Reviewed: 04/09/2007 ExitCare Patient Information 2014 [place].   ________________________________________________________________________    CLEAR LIQUID DIET   Foods Allowed                                                                     Foods Excluded  Coffee and tea, regular and decaf                             liquids that you cannot  Plain Jell-O in any flavor                                             see through such as: Fruit ices (not with fruit pulp)                                     milk, soups, orange juice  Iced Popsicles                                    All solid food Carbonated beverages, regular and diet                                    Cranberry, grape and apple juices Sports drinks like Gatorade Lightly seasoned clear broth or consume(fat free) Sugar, honey syrup  Sample Menu Breakfast  Lunch                                     Supper Cranberry juice                    Beef broth                            Chicken broth Jell-O                                     Grape juice                           Apple juice Coffee or tea                        Jell-O                                      Popsicle                                                Coffee or tea                        Coffee or tea  _____________________________________________________________________

## 2016-05-11 ENCOUNTER — Encounter (HOSPITAL_COMMUNITY): Payer: Self-pay

## 2016-05-11 ENCOUNTER — Encounter (HOSPITAL_COMMUNITY)
Admission: RE | Admit: 2016-05-11 | Discharge: 2016-05-11 | Disposition: A | Discharge status: Home / Self Care | Payer: Managed Care, Other (non HMO) | Source: Ambulatory Visit | Attending: Orthopaedic Surgery | Admitting: Orthopaedic Surgery

## 2016-05-11 DIAGNOSIS — W19XXXA Unspecified fall, initial encounter: Secondary | ICD-10-CM | POA: Diagnosis not present

## 2016-05-11 DIAGNOSIS — F329 Major depressive disorder, single episode, unspecified: Secondary | ICD-10-CM | POA: Diagnosis not present

## 2016-05-11 DIAGNOSIS — I10 Essential (primary) hypertension: Secondary | ICD-10-CM | POA: Diagnosis not present

## 2016-05-11 DIAGNOSIS — Z79899 Other long term (current) drug therapy: Secondary | ICD-10-CM | POA: Diagnosis not present

## 2016-05-11 DIAGNOSIS — M25572 Pain in left ankle and joints of left foot: Secondary | ICD-10-CM | POA: Diagnosis present

## 2016-05-11 DIAGNOSIS — S8252XA Displaced fracture of medial malleolus of left tibia, initial encounter for closed fracture: Secondary | ICD-10-CM | POA: Diagnosis not present

## 2016-05-11 DIAGNOSIS — Z7982 Long term (current) use of aspirin: Secondary | ICD-10-CM | POA: Diagnosis not present

## 2016-05-11 DIAGNOSIS — Z791 Long term (current) use of non-steroidal anti-inflammatories (NSAID): Secondary | ICD-10-CM | POA: Diagnosis not present

## 2016-05-11 HISTORY — DX: Major depressive disorder, single episode, unspecified: F32.9

## 2016-05-11 HISTORY — DX: Anxiety disorder, unspecified: F41.9

## 2016-05-11 HISTORY — DX: Depression, unspecified: F32.A

## 2016-05-11 HISTORY — DX: Other specified postprocedural states: R11.2

## 2016-05-11 HISTORY — DX: Unspecified convulsions: R56.9

## 2016-05-11 HISTORY — DX: Other specified postprocedural states: Z98.890

## 2016-05-11 HISTORY — DX: Unspecified injury of head, initial encounter: S09.90XA

## 2016-05-11 LAB — CBC
HCT: 42.5 % (ref 39.0–52.0)
Hemoglobin: 14.4 g/dL (ref 13.0–17.0)
MCH: 28.6 pg (ref 26.0–34.0)
MCHC: 33.9 g/dL (ref 30.0–36.0)
MCV: 84.5 fL (ref 78.0–100.0)
PLATELETS: 234 10*3/uL (ref 150–400)
RBC: 5.03 MIL/uL (ref 4.22–5.81)
RDW: 13.4 % (ref 11.5–15.5)
WBC: 5.9 10*3/uL (ref 4.0–10.5)

## 2016-05-11 LAB — BASIC METABOLIC PANEL
Anion gap: 7 (ref 5–15)
BUN: 27 mg/dL — ABNORMAL HIGH (ref 6–20)
CALCIUM: 8.7 mg/dL — AB (ref 8.9–10.3)
CO2: 28 mmol/L (ref 22–32)
CREATININE: 0.82 mg/dL (ref 0.61–1.24)
Chloride: 105 mmol/L (ref 101–111)
GFR calc non Af Amer: >60 mL/min (ref >60)
GLUCOSE: 91 mg/dL (ref 65–99)
Potassium: 4.2 mmol/L (ref 3.5–5.1)
Sodium: 140 mmol/L (ref 135–145)

## 2016-05-11 NOTE — Progress Notes (Signed)
BMP done 05/11/16 faxed via EPIC to Dr Zollie Beckers.

## 2016-05-11 NOTE — Progress Notes (Signed)
EKG done 05/11/16- EPIC

## 2016-05-13 ENCOUNTER — Ambulatory Visit (HOSPITAL_COMMUNITY): Payer: Managed Care, Other (non HMO) | Admitting: Anesthesiology

## 2016-05-13 ENCOUNTER — Encounter (HOSPITAL_COMMUNITY)
Admission: RE | Disposition: A | Discharge status: Home / Self Care | Payer: Self-pay | Source: Ambulatory Visit | Attending: Orthopaedic Surgery

## 2016-05-13 ENCOUNTER — Encounter (HOSPITAL_COMMUNITY): Payer: Self-pay | Admitting: Anesthesiology

## 2016-05-13 ENCOUNTER — Ambulatory Visit (HOSPITAL_COMMUNITY): Payer: Managed Care, Other (non HMO)

## 2016-05-13 ENCOUNTER — Observation Stay (HOSPITAL_COMMUNITY)
Admission: RE | Admit: 2016-05-13 | Discharge: 2016-05-15 | Disposition: A | Discharge status: Home / Self Care | Payer: Managed Care, Other (non HMO) | Source: Ambulatory Visit | Attending: Orthopaedic Surgery | Admitting: Orthopaedic Surgery

## 2016-05-13 DIAGNOSIS — I10 Essential (primary) hypertension: Secondary | ICD-10-CM | POA: Insufficient documentation

## 2016-05-13 DIAGNOSIS — F329 Major depressive disorder, single episode, unspecified: Secondary | ICD-10-CM | POA: Insufficient documentation

## 2016-05-13 DIAGNOSIS — S8253XA Displaced fracture of medial malleolus of unspecified tibia, initial encounter for closed fracture: Secondary | ICD-10-CM | POA: Diagnosis present

## 2016-05-13 DIAGNOSIS — Z419 Encounter for procedure for purposes other than remedying health state, unspecified: Secondary | ICD-10-CM

## 2016-05-13 DIAGNOSIS — Z7982 Long term (current) use of aspirin: Secondary | ICD-10-CM | POA: Insufficient documentation

## 2016-05-13 DIAGNOSIS — S8252XA Displaced fracture of medial malleolus of left tibia, initial encounter for closed fracture: Secondary | ICD-10-CM | POA: Diagnosis not present

## 2016-05-13 DIAGNOSIS — Z791 Long term (current) use of non-steroidal anti-inflammatories (NSAID): Secondary | ICD-10-CM | POA: Insufficient documentation

## 2016-05-13 DIAGNOSIS — W19XXXA Unspecified fall, initial encounter: Secondary | ICD-10-CM | POA: Insufficient documentation

## 2016-05-13 DIAGNOSIS — Z79899 Other long term (current) drug therapy: Secondary | ICD-10-CM | POA: Insufficient documentation

## 2016-05-13 HISTORY — PX: ORIF ANKLE FRACTURE: SHX5408

## 2016-05-13 SURGERY — OPEN REDUCTION INTERNAL FIXATION (ORIF) ANKLE FRACTURE
Anesthesia: General | Site: Ankle | Laterality: Left

## 2016-05-13 MED ORDER — ASPIRIN EC 325 MG PO TBEC
325.0000 mg | DELAYED_RELEASE_TABLET | Freq: Every day | ORAL | Status: DC
  Administered 2016-05-13 – 2016-05-15 (×3): 325 mg via ORAL
  Filled 2016-05-13 (×3): qty 1

## 2016-05-13 MED ORDER — OXYCODONE HCL 5 MG PO TABS
5.0000 mg | ORAL_TABLET | ORAL | Status: DC | PRN
Start: 2016-05-13 — End: 2016-05-14
  Administered 2016-05-13 – 2016-05-14 (×2): 5 mg via ORAL
  Administered 2016-05-14: 10 mg via ORAL
  Filled 2016-05-13: qty 1
  Filled 2016-05-13: qty 2
  Filled 2016-05-13 (×2): qty 1

## 2016-05-13 MED ORDER — CEFAZOLIN SODIUM-DEXTROSE 2-4 GM/100ML-% IV SOLN
INTRAVENOUS | Status: AC
  Filled 2016-05-13: qty 100

## 2016-05-13 MED ORDER — ACETAMINOPHEN 650 MG RE SUPP: 650.0000 mg | Freq: Four times a day (QID) | RECTAL | Status: DC | PRN

## 2016-05-13 MED ORDER — PROPOFOL 10 MG/ML IV BOLUS
INTRAVENOUS | Status: AC
  Filled 2016-05-13: qty 20

## 2016-05-13 MED ORDER — PROPOFOL 500 MG/50ML IV EMUL
INTRAVENOUS | Status: DC | PRN
  Administered 2016-05-13: 25 ug/kg/min via INTRAVENOUS

## 2016-05-13 MED ORDER — CEFAZOLIN SODIUM-DEXTROSE 2-4 GM/100ML-% IV SOLN
2.0000 g | INTRAVENOUS | Status: AC
  Administered 2016-05-13: 2 g via INTRAVENOUS
  Filled 2016-05-13: qty 100

## 2016-05-13 MED ORDER — LISINOPRIL 10 MG PO TABS
10.0000 mg | ORAL_TABLET | Freq: Every day | ORAL | Status: DC
  Administered 2016-05-13 – 2016-05-15 (×3): 10 mg via ORAL
  Filled 2016-05-13 (×3): qty 1

## 2016-05-13 MED ORDER — MEPERIDINE HCL 50 MG/ML IJ SOLN: 6.2500 mg | INTRAMUSCULAR | Status: DC | PRN

## 2016-05-13 MED ORDER — REMIFENTANIL HCL 1 MG IV SOLR
INTRAVENOUS | Status: AC
Start: 2016-05-13 — End: 2016-05-13
  Filled 2016-05-13: qty 2000

## 2016-05-13 MED ORDER — HYDROMORPHONE HCL 1 MG/ML IJ SOLN
0.5000 mg | INTRAMUSCULAR | Status: DC | PRN
Start: 2016-05-13 — End: 2016-05-15
  Administered 2016-05-13 – 2016-05-14 (×3): 0.5 mg via INTRAVENOUS
  Filled 2016-05-13 (×3): qty 1

## 2016-05-13 MED ORDER — PROPOFOL 10 MG/ML IV BOLUS
INTRAVENOUS | Status: AC
  Filled 2016-05-13: qty 40

## 2016-05-13 MED ORDER — EPHEDRINE SULFATE 50 MG/ML IJ SOLN
INTRAMUSCULAR | Status: DC | PRN
  Administered 2016-05-13: 5 mg via INTRAVENOUS

## 2016-05-13 MED ORDER — LACTATED RINGERS IV SOLN
INTRAVENOUS | Status: DC
  Administered 2016-05-13: 13:00:00 via INTRAVENOUS

## 2016-05-13 MED ORDER — ONDANSETRON HCL 4 MG PO TABS: 4.0000 mg | ORAL_TABLET | Freq: Four times a day (QID) | ORAL | Status: DC | PRN

## 2016-05-13 MED ORDER — MIDAZOLAM HCL 2 MG/2ML IJ SOLN
INTRAMUSCULAR | Status: AC
  Filled 2016-05-13: qty 2

## 2016-05-13 MED ORDER — ROPIVACAINE HCL 5 MG/ML IJ SOLN
INTRAMUSCULAR | Status: DC | PRN
  Administered 2016-05-13: 30.9 mL

## 2016-05-13 MED ORDER — BUPIVACAINE HCL (PF) 0.5 % IJ SOLN
INTRAMUSCULAR | Status: AC
  Filled 2016-05-13: qty 30

## 2016-05-13 MED ORDER — METHOCARBAMOL 1000 MG/10ML IJ SOLN
500.0000 mg | Freq: Four times a day (QID) | INTRAVENOUS | Status: DC | PRN
  Administered 2016-05-13: 500 mg via INTRAVENOUS
  Filled 2016-05-13: qty 550
  Filled 2016-05-13: qty 5

## 2016-05-13 MED ORDER — PROPOFOL 10 MG/ML IV BOLUS
INTRAVENOUS | Status: DC | PRN
  Administered 2016-05-13: 200 mg via INTRAVENOUS

## 2016-05-13 MED ORDER — LACTATED RINGERS IV SOLN
INTRAVENOUS | Status: DC
  Administered 2016-05-13: 14:00:00 via INTRAVENOUS

## 2016-05-13 MED ORDER — CEFAZOLIN IN D5W 1 GM/50ML IV SOLN
1.0000 g | Freq: Four times a day (QID) | INTRAVENOUS | Status: AC
  Administered 2016-05-13 – 2016-05-14 (×3): 1 g via INTRAVENOUS
  Filled 2016-05-13 (×3): qty 50

## 2016-05-13 MED ORDER — REMIFENTANIL HCL 1 MG IV SOLR
INTRAVENOUS | Status: DC | PRN
  Administered 2016-05-13: .5 ug/kg/min via INTRAVENOUS

## 2016-05-13 MED ORDER — DIPHENHYDRAMINE HCL 12.5 MG/5ML PO ELIX: 12.5000 mg | ORAL_SOLUTION | ORAL | Status: DC | PRN

## 2016-05-13 MED ORDER — FENTANYL CITRATE (PF) 100 MCG/2ML IJ SOLN
INTRAMUSCULAR | Status: AC
  Filled 2016-05-13: qty 2

## 2016-05-13 MED ORDER — ONDANSETRON HCL 4 MG/2ML IJ SOLN
4.0000 mg | Freq: Four times a day (QID) | INTRAMUSCULAR | Status: DC | PRN
  Administered 2016-05-14 – 2016-05-15 (×3): 4 mg via INTRAVENOUS
  Filled 2016-05-13 (×3): qty 2

## 2016-05-13 MED ORDER — FENTANYL CITRATE (PF) 100 MCG/2ML IJ SOLN
25.0000 ug | INTRAMUSCULAR | Status: DC | PRN
  Administered 2016-05-13: 50 ug via INTRAVENOUS

## 2016-05-13 MED ORDER — ONDANSETRON HCL 4 MG/2ML IJ SOLN
INTRAMUSCULAR | Status: DC | PRN
  Administered 2016-05-13: 4 mg via INTRAVENOUS

## 2016-05-13 MED ORDER — SODIUM CHLORIDE 0.9 % IV SOLN
INTRAVENOUS | Status: DC
  Administered 2016-05-13 – 2016-05-14 (×3): via INTRAVENOUS

## 2016-05-13 MED ORDER — CHLORHEXIDINE GLUCONATE 4 % EX LIQD: 60.0000 mL | Freq: Once | CUTANEOUS | Status: DC

## 2016-05-13 MED ORDER — CITALOPRAM HYDROBROMIDE 20 MG PO TABS
10.0000 mg | ORAL_TABLET | Freq: Every day | ORAL | Status: DC
  Administered 2016-05-14 – 2016-05-15 (×2): 10 mg via ORAL
  Filled 2016-05-13 (×2): qty 1

## 2016-05-13 MED ORDER — ACETAMINOPHEN 325 MG PO TABS: 650.0000 mg | ORAL_TABLET | Freq: Four times a day (QID) | ORAL | Status: DC | PRN

## 2016-05-13 MED ORDER — ROPIVACAINE HCL 5 MG/ML IJ SOLN
Freq: Once | INTRAMUSCULAR | Status: DC
  Filled 2016-05-13: qty 30

## 2016-05-13 MED ORDER — SODIUM CHLORIDE 0.9 % IR SOLN
Status: AC
  Filled 2016-05-13: qty 1

## 2016-05-13 MED ORDER — MIDAZOLAM HCL 5 MG/5ML IJ SOLN
INTRAMUSCULAR | Status: DC | PRN
  Administered 2016-05-13: 2 mg via INTRAVENOUS

## 2016-05-13 MED ORDER — METOCLOPRAMIDE HCL 5 MG/ML IJ SOLN
5.0000 mg | Freq: Three times a day (TID) | INTRAMUSCULAR | Status: DC | PRN
  Administered 2016-05-14: 10 mg via INTRAVENOUS
  Filled 2016-05-13: qty 2

## 2016-05-13 MED ORDER — METOCLOPRAMIDE HCL 5 MG PO TABS: 5.0000 mg | ORAL_TABLET | Freq: Three times a day (TID) | ORAL | Status: DC | PRN

## 2016-05-13 MED ORDER — SODIUM CHLORIDE 0.9 % IR SOLN
Status: DC | PRN
  Administered 2016-05-13: 500 mL

## 2016-05-13 MED ORDER — METHOCARBAMOL 500 MG PO TABS
500.0000 mg | ORAL_TABLET | Freq: Four times a day (QID) | ORAL | Status: DC | PRN
  Administered 2016-05-14 – 2016-05-15 (×2): 500 mg via ORAL
  Filled 2016-05-13 (×2): qty 1

## 2016-05-13 MED ORDER — FENTANYL CITRATE (PF) 100 MCG/2ML IJ SOLN
INTRAMUSCULAR | Status: DC | PRN
  Administered 2016-05-13: 100 ug via INTRAVENOUS

## 2016-05-13 MED ORDER — METOCLOPRAMIDE HCL 5 MG/ML IJ SOLN: 10.0000 mg | Freq: Once | INTRAMUSCULAR | Status: DC | PRN

## 2016-05-13 MED ORDER — ONDANSETRON HCL 4 MG/2ML IJ SOLN
INTRAMUSCULAR | Status: AC
  Filled 2016-05-13: qty 2

## 2016-05-13 SURGICAL SUPPLY — 41 items
BANDAGE ACE 4X5 VEL STRL LF (GAUZE/BANDAGES/DRESSINGS) ×3 IMPLANT
BANDAGE ACE 6X5 VEL STRL LF (GAUZE/BANDAGES/DRESSINGS) ×3 IMPLANT
BIT DRILL CANN 2.7 (BIT) ×2
BIT DRILL CANN 2.7MM (BIT) ×1
BIT DRILL SRG 2.7XCANN AO CPLG (BIT) ×1 IMPLANT
BIT DRL SRG 2.7XCANN AO CPLNG (BIT) ×1
CLOTH BEACON ORANGE TIMEOUT ST (SAFETY) ×3 IMPLANT
CUFF TOURN SGL QUICK 34 (TOURNIQUET CUFF) ×3
CUFF TRNQT CYL 34X4X40X1 (TOURNIQUET CUFF) ×1 IMPLANT
DECANTER SPIKE VIAL GLASS SM (MISCELLANEOUS) ×3 IMPLANT
DRAPE C-ARM 42X120 X-RAY (DRAPES) ×3 IMPLANT
DRAPE U-SHAPE 47X51 STRL (DRAPES) ×3 IMPLANT
DRSG ADAPTIC 3X8 NADH LF (GAUZE/BANDAGES/DRESSINGS) ×3 IMPLANT
DURAPREP 26ML APPLICATOR (WOUND CARE) ×3 IMPLANT
ELECT REM PT RETURN 9FT ADLT (ELECTROSURGICAL) ×3
ELECTRODE REM PT RTRN 9FT ADLT (ELECTROSURGICAL) ×1 IMPLANT
GAUZE SPONGE 4X4 12PLY STRL (GAUZE/BANDAGES/DRESSINGS) ×3 IMPLANT
GAUZE XEROFORM 1X8 LF (GAUZE/BANDAGES/DRESSINGS) ×3 IMPLANT
GLOVE BIO SURGEON STRL SZ7.5 (GLOVE) ×3 IMPLANT
GLOVE BIOGEL PI IND STRL 7.5 (GLOVE) ×1 IMPLANT
GLOVE BIOGEL PI IND STRL 8 (GLOVE) ×2 IMPLANT
GLOVE BIOGEL PI INDICATOR 7.5 (GLOVE) ×2
GLOVE BIOGEL PI INDICATOR 8 (GLOVE) ×4
GLOVE ECLIPSE 8.0 STRL XLNG CF (GLOVE) ×3 IMPLANT
GLOVE SURG SS PI 7.5 STRL IVOR (GLOVE) ×3 IMPLANT
GOWN STRL REUS W/TWL XL LVL3 (GOWN DISPOSABLE) ×9 IMPLANT
K-WIRE ORTHOPEDIC 1.4X150L (WIRE) ×6
KWIRE ORTHOPEDIC 1.4X150L (WIRE) ×2 IMPLANT
PACK TOTAL KNEE CUSTOM (KITS) ×3 IMPLANT
PAD ABD 8X10 STRL (GAUZE/BANDAGES/DRESSINGS) ×3 IMPLANT
PAD CAST 4YDX4 CTTN HI CHSV (CAST SUPPLIES) ×3 IMPLANT
PADDING CAST COTTON 4X4 STRL (CAST SUPPLIES) ×9
PADDING CAST COTTON 6X4 STRL (CAST SUPPLIES) ×3 IMPLANT
POSITIONER SURGICAL ARM (MISCELLANEOUS) ×3 IMPLANT
SCREW CANNULATED 4.0MMX48MM (Screw) ×6 IMPLANT
SUT ETHILON 2 0 PS N (SUTURE) ×3 IMPLANT
SUT VIC AB 0 CT1 36 (SUTURE) ×3 IMPLANT
SUT VIC AB 2-0 CT1 27 (SUTURE) ×3
SUT VIC AB 2-0 CT1 TAPERPNT 27 (SUTURE) ×1 IMPLANT
SYR CONTROL 10ML LL (SYRINGE) ×3 IMPLANT
TOWEL OR 17X26 10 PK STRL BLUE (TOWEL DISPOSABLE) ×6 IMPLANT

## 2016-05-13 NOTE — Progress Notes (Signed)
AssistedDr. Carignan with left, ultrasound guided, adductor canal block. Side rails up, monitors on throughout procedure. See vital signs in flow sheet. Tolerated Procedure well.  

## 2016-05-13 NOTE — Brief Op Note (Signed)
05/13/2016  3:01 PM  PATIENT:  Richard Hines  60 y.o. male  PRE-OPERATIVE DIAGNOSIS:  Displaced left medial malleolus fracture  POST-OPERATIVE DIAGNOSIS:  Displaced left medial malleolus fracture  PROCEDURE:  Procedure(s) with comments: OPEN REDUCTION INTERNAL FIXATION (ORIF)LEFT ANKLE MEDIAL MALLEOLUS FRACTURE (Left) - Adductor Block  SURGEON:  Surgeon(s) and Role:    * Mcarthur Rossetti, MD - Primary  PHYSICIAN ASSISTANT: Benita Stabile, PA-C  ANESTHESIA:   regional and general  COUNTS:  YES  DICTATION: .Other Dictation: Dictation Number [id number]  PLAN OF CARE: Admit for overnight observation  PATIENT DISPOSITION:  PACU - hemodynamically stable.   Delay start of Pharmacological VTE agent (>24hrs) due to surgical blood loss or risk of bleeding: no

## 2016-05-13 NOTE — Anesthesia Procedure Notes (Signed)
Procedure Name: LMA Insertion Performed by: Anne Fu Pre-anesthesia Checklist: Patient identified, Emergency Drugs available, Suction available, Patient being monitored and Timeout performed Patient Re-evaluated:Patient Re-evaluated prior to inductionOxygen Delivery Method: Circle system utilized Preoxygenation: Pre-oxygenation with 100% oxygen Intubation Type: IV induction Ventilation: Mask ventilation without difficulty LMA: LMA inserted LMA Size: 4.0 Number of attempts: 2 Placement Confirmation: positive ETCO2 and breath sounds checked- equal and bilateral Tube secured with: Tape

## 2016-05-13 NOTE — Anesthesia Postprocedure Evaluation (Signed)
Anesthesia Post Note  Patient: Richard Hines  Procedure(s) Performed: Procedure(s) (LRB): OPEN REDUCTION INTERNAL FIXATION (ORIF)LEFT ANKLE MEDIAL MALLEOLUS FRACTURE (Left)  Patient location during evaluation: PACU Anesthesia Type: General and Regional Level of consciousness: awake and alert Pain management: pain level controlled Vital Signs Assessment: post-procedure vital signs reviewed and stable Respiratory status: spontaneous breathing, nonlabored ventilation, respiratory function stable and patient connected to nasal cannula oxygen Cardiovascular status: blood pressure returned to baseline and stable Postop Assessment: no signs of nausea or vomiting Anesthetic complications: no    Last Vitals:  Vitals:   05/13/16 1831 05/13/16 1930  BP: (!) 145/93 (P) 130/81  Pulse: 66 (P) 67  Resp: 18 (P) 18  Temp: 36.6 C (P) 36.7 C    Last Pain:  Vitals:   05/13/16 2016  TempSrc:   PainSc: 9                  [name]
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
## 2016-05-13 NOTE — Anesthesia Procedure Notes (Signed)
Anesthesia Regional Block:  Adductor canal block  Pre-Anesthetic Checklist: ,, timeout performed, Correct Patient, Correct Site, Correct Laterality, Correct Procedure, Correct Position, site marked, Risks and benefits discussed,  Surgical consent,  Pre-op evaluation,  At surgeon's request and post-op pain management  Laterality: Left and Lower  Prep: Maximum Sterile Barrier Precautions used, chloraprep       Needles:  Injection technique: Single-shot  Needle Type: Echogenic Stimulator Needle     Needle Length: 10cm 10 cm Needle Gauge: 21 G    Additional Needles:  Procedures: ultrasound guided (picture in chart) Adductor canal block Narrative:  Injection made incrementally with aspirations every 5 mL.  Performed by: Personally   Additional Notes: Patient tolerated the procedure well without complications

## 2016-05-13 NOTE — Anesthesia Preprocedure Evaluation (Addendum)
Anesthesia Evaluation  Patient identified by MRN, date of birth, ID band Patient awake    Reviewed: Allergy & Precautions, NPO status , Patient's Chart, lab work & pertinent test results  History of Anesthesia Complications (+) PONV  Airway Mallampati: II  TM Distance: >3 FB Neck ROM: Full   Comment: Previous trach from [place] no notable dental hx.    Pulmonary neg pulmonary ROS,    Pulmonary exam normal breath sounds clear to auscultation       Cardiovascular hypertension, Pt. on medications negative cardio ROS Normal cardiovascular exam Rhythm:Regular Rate:Normal     Neuro/Psych Seizures - (30 years ago),  negative neurological ROS  negative psych ROS   GI/Hepatic negative GI ROS, Neg liver ROS,   Endo/Other  negative endocrine ROS  Renal/GU negative Renal ROS  negative genitourinary   Musculoskeletal negative musculoskeletal ROS (+)   Abdominal   Peds negative pediatric ROS (+)  Hematology negative hematology ROS (+)   Anesthesia Other Findings   Reproductive/Obstetrics negative OB ROS                            Anesthesia Physical Anesthesia Plan  ASA: II  Anesthesia Plan: General   Post-op Pain Management: GA combined w/ Regional for post-op pain   Induction: Intravenous  Airway Management Planned: LMA  Additional Equipment:   Intra-op Plan:   Post-operative Plan: Extubation in OR  Informed Consent: I have reviewed the patients History and Physical, chart, labs and discussed the procedure including the risks, benefits and alternatives for the proposed anesthesia with the patient or authorized representative who has indicated his/her understanding and acceptance.   Dental advisory given  Plan Discussed with: CRNA  Anesthesia Plan Comments: (L Adductor canal block for saphenous coverage)        Anesthesia Quick Evaluation

## 2016-05-13 NOTE — Transfer of Care (Signed)
Immediate Anesthesia Transfer of Care Note  Patient: Richard Hines  Procedure(s) Performed: Procedure(s) with comments: OPEN REDUCTION INTERNAL FIXATION (ORIF)LEFT ANKLE MEDIAL MALLEOLUS FRACTURE (Left) - Adductor Block; IV Sedation  Patient Location: PACU  Anesthesia Type:TIVA  Level of Consciousness:  sedated, patient cooperative and responds to stimulation  Airway & Oxygen Therapy:Patient Spontanous Breathing and Patient connected to face mask oxgen  Post-op Assessment:  Report given to PACU RN and Post -op Vital signs reviewed and stable  Post vital signs:  Reviewed and stable  Last Vitals:  Vitals:   05/13/16 1354 05/13/16 1355  BP:    Pulse: 67 73  Resp: 13 17  Temp:      Complications: No apparent anesthesia complications

## 2016-05-14 DIAGNOSIS — S8252XA Displaced fracture of medial malleolus of left tibia, initial encounter for closed fracture: Secondary | ICD-10-CM | POA: Diagnosis not present

## 2016-05-14 MED ORDER — PROMETHAZINE HCL 25 MG/ML IJ SOLN
12.5000 mg | Freq: Four times a day (QID) | INTRAMUSCULAR | Status: DC | PRN
  Administered 2016-05-14 (×2): 12.5 mg via INTRAVENOUS
  Filled 2016-05-14 (×3): qty 1

## 2016-05-14 MED ORDER — KETOROLAC TROMETHAMINE 30 MG/ML IJ SOLN
30.0000 mg | Freq: Four times a day (QID) | INTRAMUSCULAR | Status: DC | PRN
  Administered 2016-05-14 – 2016-05-15 (×2): 30 mg via INTRAVENOUS
  Filled 2016-05-14 (×2): qty 1

## 2016-05-14 MED ORDER — TRAMADOL HCL 50 MG PO TABS: 100.0000 mg | ORAL_TABLET | Freq: Four times a day (QID) | ORAL | 0 refills | Status: DC | PRN

## 2016-05-14 MED ORDER — METOCLOPRAMIDE HCL 5 MG/ML IJ SOLN: 10.0000 mg | Freq: Two times a day (BID) | INTRAMUSCULAR | Status: DC | PRN

## 2016-05-14 MED ORDER — TRAMADOL HCL 50 MG PO TABS
100.0000 mg | ORAL_TABLET | Freq: Four times a day (QID) | ORAL | Status: DC | PRN
  Administered 2016-05-14 – 2016-05-15 (×3): 100 mg via ORAL
  Filled 2016-05-14 (×3): qty 2

## 2016-05-14 MED ORDER — METHOCARBAMOL 500 MG PO TABS: 500.0000 mg | ORAL_TABLET | Freq: Four times a day (QID) | ORAL | 0 refills | Status: DC | PRN

## 2016-05-14 MED ORDER — ONDANSETRON 4 MG PO TBDP: 4.0000 mg | ORAL_TABLET | Freq: Three times a day (TID) | ORAL | 0 refills | Status: DC | PRN

## 2016-05-14 MED ORDER — HYDROCODONE-ACETAMINOPHEN 5-325 MG PO TABS: 1.0000 | ORAL_TABLET | ORAL | Status: DC | PRN

## 2016-05-14 NOTE — Progress Notes (Signed)
PT Cancellation Note  Patient Details Name: Richard Hines MRN: [id number] DOB: 05-09-1956   Cancelled Treatment:    Reason Eval/Treat Not Completed:  Attempted PT eva. Pt experiencing nausea. Requested PT to check back later. Will check back as schedule allows. Thanks.    Weston Anna, MPT Pager: [phone]
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
## 2016-05-14 NOTE — Progress Notes (Signed)
Patient ID: Richard Hines, male   DOB: [birth year], 58 y.o.   MRN: [id number] Awake and alert, but significant nausea and vomiting from his intolerance to pain meds.  Will need to keep him here today and likely discharge to home tomorrow.  Left LE splint clean and intact.  Toes well perfused.

## 2016-05-14 NOTE — Discharge Instructions (Signed)
Keep your splint clean and dry. No weight bearing on your left ankle at all.

## 2016-05-14 NOTE — Evaluation (Signed)
Physical Therapy Evaluation Patient Details Name: Richard Hines MRN: [id number] DOB: 04-12-1956 Today's Date: 05/14/2016   History of Present Illness  60 yo male s/p L ankle ORIF 05/13/16  Clinical Impression  On eval, pt required Min assist at times during session. He was able to hop ~15' x3 with RW on today. Distance limited by nausea, fatigue, generalized weakness (hasn't eaten or been out of bed much today). Encouraged pt to spend some time OOB on today. He agreed to sit up in recliner at end of session. Will continue to follow and progress activity as tolerated.     Follow Up Recommendations Supervision/Assistance - 24 hour    Equipment Recommendations  Rolling walker with 5" wheels (possibly-will continue to assess)    Recommendations for Other Services       Precautions / Restrictions Precautions Precautions: Fall Required Braces or Orthoses: Other Brace/Splint Other Brace/Splint: L lower leg Restrictions Weight Bearing Restrictions: Yes LLE Weight Bearing: Non weight bearing      Mobility  Bed Mobility Overal bed mobility: Needs Assistance Bed Mobility: Supine to Sit     Supine to sit: Min assist;HOB elevated     General bed mobility comments: small amount of assist for L LE.   Transfers Overall transfer level: Needs assistance Equipment used: Rolling walker (2 wheeled) Transfers: Sit to/from Stand Sit to Stand: Min assist         General transfer comment: small amount of assist. VCs for NWB L LE, hand placement.  Ambulation/Gait Ambulation/Gait assistance: Min assist Ambulation Distance (Feet): 15 Feet (x3) Assistive device: Rolling walker (2 wheeled) Gait Pattern/deviations: Step-to pattern     General Gait Details: Intermittent assist to stabilize. VCs safety, adherence to NWB. Pt fatigues fairly easily. Distance limited by nausea, "feeling weak".  Stairs            Wheelchair Mobility    Modified Rankin (Stroke Patients Only)        Balance Overall balance assessment: Needs assistance;History of Falls         Standing balance support: Bilateral upper extremity supported Standing balance-Leahy Scale: Poor                               Pertinent Vitals/Pain Pain Assessment: 0-10 Pain Score: 5  Pain Location: L ankle Pain Descriptors / Indicators: Sore Pain Intervention(s): Limited activity within patient's tolerance;Repositioned    Home Living Family/patient expects to be discharged to:: Private residence Living Arrangements: Spouse/significant other Available Help at Discharge: Family Type of Home: House Home Access: Stairs to enter Entrance Stairs-Rails: None Technical brewer of Steps: 2 Home Layout: One level Home Equipment: Crutches      Prior Function           Comments: using crutches up until surgery     Hand Dominance        Extremity/Trunk Assessment   Upper Extremity Assessment: Overall WFL for tasks assessed                  Cervical / Trunk Assessment: Normal  Communication   Communication: No difficulties  Cognition Arousal/Alertness: Awake/alert Behavior During Therapy: WFL for tasks assessed/performed Overall Cognitive Status: Within Functional Limits for tasks assessed                      General Comments      Exercises        Assessment/Plan  PT Assessment Patient needs continued PT services  PT Diagnosis Difficulty walking;Acute pain   PT Problem List Decreased activity tolerance;Decreased balance;Decreased mobility;Pain;Decreased knowledge of use of DME  PT Treatment Interventions DME instruction;Gait training;Stair training;Functional mobility training;Balance training;Therapeutic exercise;Therapeutic activities;Patient/family education   PT Goals (Current goals can be found in the Care Plan section) Acute Rehab PT Goals Patient Stated Goal: to feel better. home tomorrow.  PT Goal Formulation: With  patient Time For Goal Achievement: 05/28/16 Potential to Achieve Goals: Good    Frequency Min 5X/week   Barriers to discharge        Co-evaluation               End of Session Equipment Utilized During Treatment: Gait belt Activity Tolerance: Patient limited by fatigue (Limited by nausea) Patient left: in chair;with call bell/phone within reach;with chair alarm set      Functional Assessment Tool Used: clinical judgement Functional Limitation: Mobility: Walking and moving around Mobility: Walking and Moving Around Current Status [id number]): At least 1 percent but less than 20 percent impaired, limited or restricted Mobility: Walking and Moving Around Goal Status [phone]): At least 1 percent but less than 20 percent impaired, limited or restricted    Time: 1401-1426 PT Time Calculation (min) (ACUTE ONLY): 25 min   Charges:   PT Evaluation $PT Eval Low Complexity: 1 Procedure     PT G Codes:   PT G-Codes **NOT FOR INPATIENT CLASS** Functional Assessment Tool Used: clinical judgement Functional Limitation: Mobility: Walking and moving around Mobility: Walking and Moving Around Current Status [id number]): At least 1 percent but less than 20 percent impaired, limited or restricted Mobility: Walking and Moving Around Goal Status [phone]): At least 1 percent but less than 20 percent impaired, limited or restricted    Weston Anna, MPT Pager: [phone]

## 2016-05-14 NOTE — Care Management Note (Addendum)
Case Management Note  Patient Details  Name: SHAUNTE ERDMAN MRN: [id number] Date of Birth: 12/02/1955  Subjective/Objective:  L ankle ORIF                   Action/Plan: Discharge Planning:  NCM spoke to pt and gave permission to contact wife, Crystal # [phone]. Pt states he has crutches in the car. Contacted [facility] for RW for home.    Expected Discharge Date:  05/15/2016              Expected Discharge Plan:  Home/Self Care  In-House Referral:  NA  Discharge planning Services  CM Consult  Post Acute Care Choice:  NA Choice offered to:  NA  DME Arranged:  Walker rolling DME Agency:  [place]:  NA [place] Agency:  NA  Status of Service:  Completed, signed off  If discussed at [place] of Stay Meetings, dates discussed:    Additional Comments:  Erenest Rasher, RN 05/14/2016, 5:02 PM

## 2016-05-15 DIAGNOSIS — S8252XA Displaced fracture of medial malleolus of left tibia, initial encounter for closed fracture: Secondary | ICD-10-CM | POA: Diagnosis not present

## 2016-05-15 MED ORDER — ASPIRIN 325 MG PO TBEC: 325.0000 mg | DELAYED_RELEASE_TABLET | Freq: Every day | ORAL | 0 refills | Status: DC

## 2016-05-15 MED ORDER — TRAMADOL HCL 50 MG PO TABS: 100.0000 mg | ORAL_TABLET | Freq: Four times a day (QID) | ORAL | Status: DC | PRN

## 2016-05-15 MED ORDER — HYDROCODONE-ACETAMINOPHEN 5-325 MG PO TABS: 1.0000 | ORAL_TABLET | ORAL | Status: DC | PRN

## 2016-05-15 MED ORDER — TRAMADOL HCL 50 MG PO TABS: 100.0000 mg | ORAL_TABLET | Freq: Four times a day (QID) | ORAL | 0 refills | Status: DC | PRN
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
## 2016-05-15 NOTE — Progress Notes (Signed)
Patient discharged.  Leaving with walker and prescriptions.  Accompanied by wife.  Denies pain.  Room air.  No s/s of distress.  Reports understanding of discharge instructions.  No complaints.

## 2016-05-15 NOTE — Progress Notes (Signed)
Subjective: 2 Days Post-Op Procedure(s) (LRB): OPEN REDUCTION INTERNAL FIXATION (ORIF)LEFT ANKLE MEDIAL MALLEOLUS FRACTURE (Left) Patient reports pain as mild.    Objective: Vital signs in last 24 hours: Temp:  [97.9 F (36.6 C)-98.7 F (37.1 C)] 98.7 F (37.1 C) (08/06 0610) Pulse Rate:  [63-85] 72 (08/06 0610) Resp:  [16-18] 16 (08/06 0610) BP: (116-136)/(69-93) 116/72 (08/06 0610) SpO2:  [97 %-100 %] 97 % (08/06 0610)  Intake/Output from previous day: 08/05 0701 - 08/06 0700 In: 360 [P.O.:360] Out: -  Intake/Output this shift: No intake/output data recorded.  No results for input(s): HGB in the last 72 hours. No results for input(s): WBC, RBC, HCT, PLT in the last 72 hours. No results for input(s): NA, K, CL, CO2, BUN, CREATININE, GLUCOSE, CALCIUM in the last 72 hours. No results for input(s): LABPT, INR in the last 72 hours.  Neurologically intact  Assessment/Plan: 2 Days Post-Op Procedure(s) (LRB): OPEN REDUCTION INTERNAL FIXATION (ORIF)LEFT ANKLE MEDIAL MALLEOLUS FRACTURE (Left) Up with therapy  Home likely this afternoon after therapy  Saman Giddens C 05/15/2016, 8:08 AM

## 2016-05-15 NOTE — Progress Notes (Signed)
Physical Therapy Treatment Patient Details Name: MARUIN EMBREE MRN: [id number] DOB: 01/13/1956 Today's Date: 06-01-2016    History of Present Illness 60 yo male s/p L ankle ORIF 05/13/16    PT Comments    Pt progressing with mobility and much more stable on RW.  Reviewed stairs with pt and spouse.  Follow Up Recommendations  Supervision/Assistance - 24 hour     Equipment Recommendations  Rolling walker with 5" wheels    Recommendations for Other Services       Precautions / Restrictions Precautions Precautions: Fall Other Brace/Splint: L lower leg Restrictions Weight Bearing Restrictions: Yes LLE Weight Bearing: Non weight bearing    Mobility  Bed Mobility               General bed mobility comments: Pt to EOB ind  Transfers Overall transfer level: Needs assistance Equipment used: Rolling walker (2 wheeled) Transfers: Sit to/from Stand Sit to Stand: Min guard;Supervision         General transfer comment: cues for LE management and use of UEs to self assist  Ambulation/Gait Ambulation/Gait assistance: Min guard;Supervision Ambulation Distance (Feet): 50 Feet Assistive device: Rolling walker (2 wheeled);Crutches Gait Pattern/deviations: Step-to pattern;Shuffle;Trunk flexed Gait velocity: decr Gait velocity interpretation: Below normal speed for age/gender General Gait Details: cues for posture and position from RW   Stairs Stairs: Yes Stairs assistance: Min assist Stair Management: No rails;Step to pattern;Forwards;With crutches Number of Stairs: 4 General stair comments: cues for sequence and foot/crutch placement.  Written instructions provided.  Wife present  Wheelchair Mobility    Modified Rankin (Stroke Patients Only)       Balance                                    Cognition Arousal/Alertness: Awake/alert Behavior During Therapy: WFL for tasks assessed/performed Overall Cognitive Status: Within Functional Limits  for tasks assessed                      Exercises      General Comments        Pertinent Vitals/Pain Pain Assessment: 0-10 Pain Score: 2  Pain Location: L ankle Pain Descriptors / Indicators: Aching;Sore Pain Intervention(s): Limited activity within patient's tolerance;Monitored during session    Home Living                      Prior Function            PT Goals (current goals can now be found in the care plan section) Acute Rehab PT Goals Patient Stated Goal: home PT Goal Formulation: With patient Time For Goal Achievement: 05/28/16 Potential to Achieve Goals: Good Progress towards PT goals: Progressing toward goals    Frequency  Min 5X/week    PT Plan Current plan remains appropriate    Co-evaluation             End of Session Equipment Utilized During Treatment: Gait belt Activity Tolerance: Patient tolerated treatment well Patient left: in chair;with call bell/phone within reach;with chair alarm set;with family/visitor present     Time: 1341-1402 PT Time Calculation (min) (ACUTE ONLY): 21 min  Charges:  $Gait Training: 8-22 mins                    G Codes:      Eliezer Khawaja Jun 01, 2016, 2:11 PM

## 2016-05-15 NOTE — Progress Notes (Signed)
Physical Therapy Treatment Patient Details Name: Richard Hines MRN: [id number] DOB: 28-Mar-1956 Today's Date: 05/15/2016    History of Present Illness 60 yo male s/p L ankle ORIF 05/13/16    PT Comments    Pt motivated but with limited endurance noted.  Reviewed stairs and ambulation with crutches and RW.  Will follow early pm.  Follow Up Recommendations  Supervision/Assistance - 24 hour     Equipment Recommendations  Rolling walker with 5" wheels    Recommendations for Other Services       Precautions / Restrictions Precautions Precautions: Fall Required Braces or Orthoses: Other Brace/Splint Other Brace/Splint: L lower leg Restrictions Weight Bearing Restrictions: Yes LLE Weight Bearing: Non weight bearing    Mobility  Bed Mobility Overal bed mobility: Needs Assistance Bed Mobility: Supine to Sit     Supine to sit: Supervision     General bed mobility comments: Pt self assisted L LE with UEs  Transfers Overall transfer level: Needs assistance Equipment used: Rolling walker (2 wheeled) Transfers: Sit to/from Stand Sit to Stand: Min guard         General transfer comment: cues for LE management and use of UEs to self assist  Ambulation/Gait Ambulation/Gait assistance: Min assist;Min guard Ambulation Distance (Feet): 45 Feet Assistive device: Rolling walker (2 wheeled);Crutches Gait Pattern/deviations: Step-to pattern;Step-through pattern;Shuffle;Trunk flexed Gait velocity: decr Gait velocity interpretation: Below normal speed for age/gender General Gait Details: 66' with RW, min guard and cues for position from RW, 45' with crutches, cues for sequence and foot placement and 1 episode min assit to maintain balance   Stairs Stairs: Yes Stairs assistance: Min assist Stair Management: Step to pattern;Forwards;With crutches Number of Stairs: 4 General stair comments: cues for sequence and foot/crutch placement.  Written instructions provided.  One  episode balance loss on stairs.  Wheelchair Mobility    Modified Rankin (Stroke Patients Only)       Balance Overall balance assessment: Needs assistance Sitting-balance support: Feet supported;No upper extremity supported Sitting balance-Leahy Scale: Good     Standing balance support: Bilateral upper extremity supported Standing balance-Leahy Scale: Fair                      Cognition Arousal/Alertness: Awake/alert Behavior During Therapy: WFL for tasks assessed/performed Overall Cognitive Status: Within Functional Limits for tasks assessed                      Exercises      General Comments        Pertinent Vitals/Pain Pain Assessment: 0-10 Pain Score: 2  Pain Location: L ankle Pain Descriptors / Indicators: Sore Pain Intervention(s): Limited activity within patient's tolerance;Monitored during session;Ice applied    Home Living                      Prior Function            PT Goals (current goals can now be found in the care plan section) Acute Rehab PT Goals Patient Stated Goal: to feel better. home tomorrow.  PT Goal Formulation: With patient Time For Goal Achievement: 05/28/16 Potential to Achieve Goals: Good Progress towards PT goals: Progressing toward goals    Frequency  Min 5X/week    PT Plan Current plan remains appropriate    Co-evaluation             End of Session Equipment Utilized During Treatment: Gait belt Activity Tolerance: Patient limited by fatigue;Patient tolerated  treatment well Patient left: in chair;with call bell/phone within reach;with chair alarm set     Time: [phone] PT Time Calculation (min) (ACUTE ONLY): 41 min  Charges:  $Gait Training: 23-37 mins $Therapeutic Activity: 8-22 mins                    G Codes:      Richard Hines 10-Jun-2016, 12:41 PM

## 2016-05-16 NOTE — Addendum Note (Signed)
Addendum  created 05/16/16 1041 by Lollie Sails, CRNA   Charge Capture section accepted

## 2016-07-20 ENCOUNTER — Ambulatory Visit (INDEPENDENT_AMBULATORY_CARE_PROVIDER_SITE_OTHER): Payer: Managed Care, Other (non HMO) | Admitting: Orthopaedic Surgery

## 2016-07-20 DIAGNOSIS — S8252XD Displaced fracture of medial malleolus of left tibia, subsequent encounter for closed fracture with routine healing: Secondary | ICD-10-CM | POA: Diagnosis not present

## 2016-12-23 ENCOUNTER — Encounter: Payer: Self-pay | Admitting: *Deleted

## 2016-12-26 ENCOUNTER — Ambulatory Visit
Admission: RE | Admit: 2016-12-26 | Discharge: 2016-12-26 | Disposition: A | Discharge status: Home / Self Care | Payer: Managed Care, Other (non HMO) | Source: Ambulatory Visit | Attending: Unknown Physician Specialty | Admitting: Unknown Physician Specialty

## 2016-12-26 ENCOUNTER — Ambulatory Visit: Payer: Managed Care, Other (non HMO) | Admitting: Anesthesiology

## 2016-12-26 ENCOUNTER — Encounter
Admission: RE | Disposition: A | Discharge status: Home / Self Care | Payer: Self-pay | Source: Ambulatory Visit | Attending: Unknown Physician Specialty

## 2016-12-26 ENCOUNTER — Encounter: Payer: Self-pay | Admitting: *Deleted

## 2016-12-26 DIAGNOSIS — F419 Anxiety disorder, unspecified: Secondary | ICD-10-CM | POA: Diagnosis not present

## 2016-12-26 DIAGNOSIS — Z1211 Encounter for screening for malignant neoplasm of colon: Secondary | ICD-10-CM | POA: Diagnosis present

## 2016-12-26 DIAGNOSIS — G4700 Insomnia, unspecified: Secondary | ICD-10-CM | POA: Insufficient documentation

## 2016-12-26 DIAGNOSIS — Z79899 Other long term (current) drug therapy: Secondary | ICD-10-CM | POA: Insufficient documentation

## 2016-12-26 DIAGNOSIS — Z7982 Long term (current) use of aspirin: Secondary | ICD-10-CM | POA: Insufficient documentation

## 2016-12-26 DIAGNOSIS — D122 Benign neoplasm of ascending colon: Secondary | ICD-10-CM | POA: Insufficient documentation

## 2016-12-26 DIAGNOSIS — I10 Essential (primary) hypertension: Secondary | ICD-10-CM | POA: Diagnosis not present

## 2016-12-26 DIAGNOSIS — F329 Major depressive disorder, single episode, unspecified: Secondary | ICD-10-CM | POA: Diagnosis not present

## 2016-12-26 DIAGNOSIS — K640 First degree hemorrhoids: Secondary | ICD-10-CM | POA: Insufficient documentation

## 2016-12-26 HISTORY — PX: COLONOSCOPY WITH PROPOFOL: SHX5780

## 2016-12-26 SURGERY — COLONOSCOPY WITH PROPOFOL
Anesthesia: General

## 2016-12-26 MED ORDER — MIDAZOLAM HCL 2 MG/2ML IJ SOLN
INTRAMUSCULAR | Status: AC
  Filled 2016-12-26: qty 2

## 2016-12-26 MED ORDER — PROPOFOL 500 MG/50ML IV EMUL
INTRAVENOUS | Status: DC | PRN
  Administered 2016-12-26: 75 ug/kg/min via INTRAVENOUS

## 2016-12-26 MED ORDER — PROPOFOL 10 MG/ML IV BOLUS
INTRAVENOUS | Status: AC
  Filled 2016-12-26: qty 20

## 2016-12-26 MED ORDER — LIDOCAINE HCL (PF) 2 % IJ SOLN
INTRAMUSCULAR | Status: DC | PRN
  Administered 2016-12-26: 50 mg

## 2016-12-26 MED ORDER — SODIUM CHLORIDE 0.9 % IV SOLN
INTRAVENOUS | Status: DC
  Administered 2016-12-26 (×2): via INTRAVENOUS

## 2016-12-26 MED ORDER — LIDOCAINE HCL (PF) 2 % IJ SOLN
INTRAMUSCULAR | Status: AC
  Filled 2016-12-26: qty 2

## 2016-12-26 MED ORDER — PROPOFOL 10 MG/ML IV BOLUS
INTRAVENOUS | Status: DC | PRN
  Administered 2016-12-26: 20 mg via INTRAVENOUS
  Administered 2016-12-26: 30 mg via INTRAVENOUS

## 2016-12-26 MED ORDER — SODIUM CHLORIDE 0.9 % IV SOLN: INTRAVENOUS | Status: DC

## 2016-12-26 MED ORDER — MIDAZOLAM HCL 5 MG/5ML IJ SOLN
INTRAMUSCULAR | Status: DC | PRN
  Administered 2016-12-26: 2 mg via INTRAVENOUS

## 2016-12-26 NOTE — Op Note (Signed)
[facility] Gastroenterology Patient Name: Richard Hines Procedure Date: 12/26/2016 4:36 PM MRN: [id number] Account #: [id number] Date of Birth: 04/07/1956 Admit Type: Outpatient Age: 61 Room: [facility] ENDO ROOM 1 Gender: Male Note Status: Finalized Procedure:            Colonoscopy Indications:          Screening for colorectal malignant neoplasm Providers:            Manya Silvas, MD Referring MD:         Irven Easterly. Kary Kos, MD (Referring MD) Medicines:            Propofol per Anesthesia Complications:        No immediate complications. Procedure:            Pre-Anesthesia Assessment:                       - After reviewing the risks and benefits, the patient                        was deemed in satisfactory condition to undergo the                        procedure.                       After obtaining informed consent, the colonoscope was                        passed under direct vision. Throughout the procedure,                        the patient's blood pressure, pulse, and oxygen                        saturations were monitored continuously. The                        Colonoscope was introduced through the anus and                        advanced to the the cecum, identified by appendiceal                        orifice and ileocecal valve. The colonoscopy was                        performed without difficulty. The patient tolerated the                        procedure well. The quality of the bowel preparation                        was good. Findings:      A diminutive polyp was found in the ascending colon. The polyp was       sessile. The polyp was removed with a jumbo cold forceps. Resection and       retrieval were complete.      A small polyp was found in the ascending colon. The polyp was sessile.       The polyp was removed with a hot snare. Resection and retrieval were  complete.      Internal hemorrhoids were found during  endoscopy. The hemorrhoids were       small and Grade I (internal hemorrhoids that do not prolapse).      The exam was otherwise without abnormality. Impression:           - One diminutive polyp in the ascending colon, removed                        with a jumbo cold forceps. Resected and retrieved.                       - One small polyp in the ascending colon, removed with                        a hot snare. Resected and retrieved.                       - Internal hemorrhoids.                       - The examination was otherwise normal. Recommendation:       - Await pathology results. Manya Silvas, MD 12/26/2016 5:09:24 PM This report has been signed electronically. Number of Addenda: 0 Note Initiated On: 12/26/2016 4:36 PM Scope Withdrawal Time: 0 hours 10 minutes 43 seconds  Total Procedure Duration: 0 hours 21 minutes 8 seconds       [facility]

## 2016-12-26 NOTE — Transfer of Care (Signed)
Immediate Anesthesia Transfer of Care Note  Patient: Richard Hines  Procedure(s) Performed: Procedure(s): COLONOSCOPY WITH PROPOFOL (N/A)  Patient Location: PACU  Anesthesia Type:General  Level of Consciousness: sedated  Airway & Oxygen Therapy: Patient Spontanous Breathing and Patient connected to nasal cannula oxygen  Post-op Assessment: Report given to RN and Post -op Vital signs reviewed and stable  Post vital signs: Reviewed and stable  Last Vitals:  Vitals:   12/26/16 1433  BP: 112/79  Pulse: 93  Resp: 14  Temp: 36.6 C    Last Pain:  Vitals:   12/26/16 1433  TempSrc: Tympanic         Complications: No apparent anesthesia complications
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
## 2016-12-26 NOTE — Anesthesia Preprocedure Evaluation (Signed)
Anesthesia Evaluation  Patient identified by MRN, date of birth, ID band Patient awake    Reviewed: Allergy & Precautions, NPO status , Patient's Chart, lab work & pertinent test results, reviewed documented beta blocker date and time   History of Anesthesia Complications (+) PONV and history of anesthetic complications  Airway Mallampati: II  TM Distance: >3 FB     Dental  (+) Chipped   Pulmonary           Cardiovascular hypertension, Pt. on medications      Neuro/Psych Seizures -,  PSYCHIATRIC DISORDERS Anxiety Depression    GI/Hepatic   Endo/Other    Renal/GU      Musculoskeletal   Abdominal   Peds  Hematology   Anesthesia Other Findings Last seizure 30 yrs ago.  Reproductive/Obstetrics                             Anesthesia Physical Anesthesia Plan  ASA: III  Anesthesia Plan: General   Post-op Pain Management:    Induction: Intravenous  Airway Management Planned: Nasal Cannula  Additional Equipment:   Intra-op Plan:   Post-operative Plan:   Informed Consent: I have reviewed the patients History and Physical, chart, labs and discussed the procedure including the risks, benefits and alternatives for the proposed anesthesia with the patient or authorized representative who has indicated his/her understanding and acceptance.     Plan Discussed with: CRNA  Anesthesia Plan Comments:         Anesthesia Quick Evaluation

## 2016-12-26 NOTE — Anesthesia Post-op Follow-up Note (Cosign Needed)
Anesthesia QCDR form completed.        

## 2016-12-27 ENCOUNTER — Encounter: Payer: Self-pay | Admitting: Unknown Physician Specialty

## 2016-12-28 LAB — SURGICAL PATHOLOGY

## 2016-12-29 NOTE — Anesthesia Postprocedure Evaluation (Signed)
Anesthesia Post Note  Patient: Richard Hines  Procedure(s) Performed: Procedure(s) (LRB): COLONOSCOPY WITH PROPOFOL (N/A)  Patient location during evaluation: Endoscopy Anesthesia Type: General Level of consciousness: awake and alert Pain management: pain level controlled Vital Signs Assessment: post-procedure vital signs reviewed and stable Respiratory status: spontaneous breathing, nonlabored ventilation, respiratory function stable and patient connected to nasal cannula oxygen Cardiovascular status: blood pressure returned to baseline and stable Postop Assessment: no signs of nausea or vomiting Anesthetic complications: no     Last Vitals:  Vitals:   12/26/16 1727 12/26/16 1737  BP: 95/76 101/77  Pulse: 66 62  Resp: 15 16  Temp:      Last Pain:  Vitals:   12/27/16 0741  TempSrc:   PainSc: 0-No pain                 Martha Clan

## 2017-03-01 ENCOUNTER — Encounter (INDEPENDENT_AMBULATORY_CARE_PROVIDER_SITE_OTHER): Payer: Self-pay

## 2017-03-01 ENCOUNTER — Ambulatory Visit (INDEPENDENT_AMBULATORY_CARE_PROVIDER_SITE_OTHER): Payer: Managed Care, Other (non HMO) | Admitting: Orthopaedic Surgery

## 2017-03-01 DIAGNOSIS — M79604 Pain in right leg: Secondary | ICD-10-CM

## 2017-03-01 DIAGNOSIS — M79605 Pain in left leg: Secondary | ICD-10-CM

## 2017-03-01 MED ORDER — GABAPENTIN 300 MG PO CAPS: 300.0000 mg | ORAL_CAPSULE | Freq: Every day | ORAL | 0 refills | Status: DC
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
## 2017-03-02 ENCOUNTER — Other Ambulatory Visit (INDEPENDENT_AMBULATORY_CARE_PROVIDER_SITE_OTHER): Payer: Self-pay

## 2017-03-02 DIAGNOSIS — M5441 Lumbago with sciatica, right side: Secondary | ICD-10-CM

## 2017-03-02 DIAGNOSIS — M5442 Lumbago with sciatica, left side: Principal | ICD-10-CM

## 2017-03-03 ENCOUNTER — Telehealth (INDEPENDENT_AMBULATORY_CARE_PROVIDER_SITE_OTHER): Payer: Self-pay

## 2017-03-03 NOTE — Telephone Encounter (Signed)
Patient would like a Rx for Pain, stated that he is having issues with his legs when he gets up in the morning and throughout the day. [facility] on [address].  CB# is [phone]

## 2017-03-07 ENCOUNTER — Other Ambulatory Visit (INDEPENDENT_AMBULATORY_CARE_PROVIDER_SITE_OTHER): Payer: Self-pay

## 2017-03-07 ENCOUNTER — Other Ambulatory Visit (INDEPENDENT_AMBULATORY_CARE_PROVIDER_SITE_OTHER): Payer: Self-pay | Admitting: Orthopaedic Surgery

## 2017-03-07 MED ORDER — HYDROCODONE-ACETAMINOPHEN 5-325 MG PO TABS: 1.0000 | ORAL_TABLET | Freq: Two times a day (BID) | ORAL | 0 refills | Status: DC | PRN

## 2017-03-07 MED ORDER — TRAMADOL HCL 50 MG PO TABS: 100.0000 mg | ORAL_TABLET | Freq: Four times a day (QID) | ORAL | 0 refills | Status: DC | PRN

## 2017-03-07 NOTE — Telephone Encounter (Signed)
Can come and pick up a hydrocodone script

## 2017-03-07 NOTE — Telephone Encounter (Signed)
Please advise 

## 2017-03-07 NOTE — Telephone Encounter (Signed)
Can you cancel the hydrocodone? He works for the [employer] and can't take that at work, I did call in the tramadol to his pharmacy instead [place]-

## 2017-03-14 ENCOUNTER — Other Ambulatory Visit (INDEPENDENT_AMBULATORY_CARE_PROVIDER_SITE_OTHER): Payer: Self-pay

## 2017-03-14 DIAGNOSIS — M545 Low back pain: Secondary | ICD-10-CM

## 2017-03-15 ENCOUNTER — Ambulatory Visit (INDEPENDENT_AMBULATORY_CARE_PROVIDER_SITE_OTHER): Payer: Managed Care, Other (non HMO) | Admitting: Orthopaedic Surgery

## 2017-03-20 ENCOUNTER — Other Ambulatory Visit: Payer: Managed Care, Other (non HMO)

## 2017-03-20 ENCOUNTER — Ambulatory Visit
Admission: RE | Admit: 2017-03-20 | Discharge: 2017-03-20 | Disposition: A | Discharge status: Home / Self Care | Payer: Managed Care, Other (non HMO) | Source: Ambulatory Visit | Attending: Orthopaedic Surgery | Admitting: Orthopaedic Surgery

## 2017-03-20 DIAGNOSIS — M545 Low back pain: Secondary | ICD-10-CM

## 2017-03-25 ENCOUNTER — Other Ambulatory Visit: Payer: Managed Care, Other (non HMO)

## 2017-03-28 ENCOUNTER — Encounter (INDEPENDENT_AMBULATORY_CARE_PROVIDER_SITE_OTHER): Payer: Self-pay | Admitting: Orthopaedic Surgery

## 2017-03-28 ENCOUNTER — Ambulatory Visit (INDEPENDENT_AMBULATORY_CARE_PROVIDER_SITE_OTHER): Payer: Managed Care, Other (non HMO) | Admitting: Orthopaedic Surgery

## 2017-03-28 ENCOUNTER — Telehealth (INDEPENDENT_AMBULATORY_CARE_PROVIDER_SITE_OTHER): Payer: Self-pay | Admitting: Orthopaedic Surgery

## 2017-03-28 VITALS — Ht 69.0 in | Wt 195.0 lb

## 2017-03-28 DIAGNOSIS — M48061 Spinal stenosis, lumbar region without neurogenic claudication: Secondary | ICD-10-CM | POA: Insufficient documentation

## 2017-03-28 MED ORDER — METHOCARBAMOL 500 MG PO TABS: 500.0000 mg | ORAL_TABLET | Freq: Four times a day (QID) | ORAL | 0 refills | Status: DC | PRN

## 2017-03-28 NOTE — Telephone Encounter (Signed)
Patient called asked if a return to work note can be email or faxed to [employer] (nurse). Please email note to [email or web address]. The fax # is [phone]  The number to contact patient is [phone]

## 2017-03-28 NOTE — Progress Notes (Signed)
The patient is coming in for return after CT scan of the lumbar spine. He is having significant bilateral leg pain and set a history of back surgery in the past. I wanted to obtain an MRI but his insurance company denied assessment of her CT scan instead. He said he still has pain and cramping in the back of his legs and thighs as well as some low back pain but it is calm down recently with activity modification. He says it feels more of muscle cramps. He still denies any change in bowel or bladder function.  On examination his lumbar spinous flexion extension is still quite limited and also painful. He has positive straight leg raise bilaterally. There is no significant weakness is change from his chronic problems in his legs.  A CT scan shows severe stenosis at L4-L5 with a 4 mm anterolisthesis at that level. He also has moderate stenosis at levels above and below that as well.  At this point he like to have a muscle relaxants and in which I agree with something like Robaxin and we'll send this in. His original surgery was done by Dr. Vertell Limber here in town and a totally can certainly refer him back to Dr. Vertell Limber if his symptoms are worsening. He like to try the muscle relaxant and activity modification longer. Another option would be considering injections if needed. I'll see him back in 4 weeks to see how is doing overall.

## 2017-03-29 ENCOUNTER — Telehealth (INDEPENDENT_AMBULATORY_CARE_PROVIDER_SITE_OTHER): Payer: Self-pay | Admitting: Orthopaedic Surgery

## 2017-03-29 MED ORDER — METHOCARBAMOL 500 MG PO TABS: 500.0000 mg | ORAL_TABLET | Freq: Four times a day (QID) | ORAL | 0 refills | Status: DC | PRN

## 2017-03-29 NOTE — Telephone Encounter (Signed)
Ok for return to work note? 

## 2017-03-29 NOTE — Telephone Encounter (Signed)
yes

## 2017-03-29 NOTE — Telephone Encounter (Signed)
Refill sent to pharmacy.   

## 2017-03-29 NOTE — Telephone Encounter (Signed)
Ok for refill? 

## 2017-03-29 NOTE — Telephone Encounter (Signed)
Rx Methocarbamol, [facility] in [place]

## 2017-03-29 NOTE — Telephone Encounter (Signed)
Ok to give a return to work note

## 2017-03-30 ENCOUNTER — Encounter (INDEPENDENT_AMBULATORY_CARE_PROVIDER_SITE_OTHER): Payer: Self-pay

## 2017-03-30 ENCOUNTER — Other Ambulatory Visit (INDEPENDENT_AMBULATORY_CARE_PROVIDER_SITE_OTHER): Payer: Self-pay

## 2017-03-30 ENCOUNTER — Telehealth (INDEPENDENT_AMBULATORY_CARE_PROVIDER_SITE_OTHER): Payer: Self-pay | Admitting: Orthopaedic Surgery

## 2017-03-30 DIAGNOSIS — M48061 Spinal stenosis, lumbar region without neurogenic claudication: Secondary | ICD-10-CM

## 2017-03-30 NOTE — Telephone Encounter (Signed)
Sent order to pool for scheduling with Vertell Limber

## 2017-03-30 NOTE — Telephone Encounter (Signed)
I was going to send him for a referral to Neurosurgery and Dr. Melvenia Beam, or Saintclair Halsted at [facility] to evaluate his severe lumbar stenosis.

## 2017-03-30 NOTE — Telephone Encounter (Signed)
Can you enter this return to work note for patient and have him pick up since I am in [place] office? Thanks.

## 2017-03-30 NOTE — Telephone Encounter (Signed)
Please advise 

## 2017-03-30 NOTE — Telephone Encounter (Signed)
Patient would like to go ahead with scheduling surgery

## 2017-03-30 NOTE — Telephone Encounter (Signed)
Can you do me a favor and Email the letter I wrote to  [email or web address]

## 2017-03-31 ENCOUNTER — Telehealth (INDEPENDENT_AMBULATORY_CARE_PROVIDER_SITE_OTHER): Payer: Self-pay | Admitting: *Deleted

## 2017-03-31 NOTE — Telephone Encounter (Signed)
Patient aware we are working on this and he does not need to sign release

## 2017-03-31 NOTE — Telephone Encounter (Signed)
Pt called asking if his records have been sent with the referral to Dr. Vertell Limber. Or if he needs to fill out a release form for this to be done.

## 2017-04-04 ENCOUNTER — Telehealth (INDEPENDENT_AMBULATORY_CARE_PROVIDER_SITE_OTHER): Payer: Self-pay | Admitting: Orthopaedic Surgery

## 2017-04-04 NOTE — Telephone Encounter (Signed)
Returned call to patient mailed after visit summary per patient request for [facility] 03/01/17 and 03/28/17.

## 2017-04-10 ENCOUNTER — Telehealth (INDEPENDENT_AMBULATORY_CARE_PROVIDER_SITE_OTHER): Payer: Self-pay

## 2017-04-10 NOTE — Telephone Encounter (Signed)
Can you do me a favor and let him know this is at the front desk

## 2017-04-10 NOTE — Telephone Encounter (Signed)
Talked with patient and advised him that office notes are ready to be picked up at the front desk.

## 2017-04-10 NOTE — Telephone Encounter (Signed)
Patient would like a copy of his Last 2 office visits.  Stated that he will come by and pick up this afternoon.  Cb# is [phone].

## 2017-04-20 ENCOUNTER — Other Ambulatory Visit: Payer: Self-pay | Admitting: Neurosurgery

## 2017-04-20 DIAGNOSIS — M4316 Spondylolisthesis, lumbar region: Secondary | ICD-10-CM

## 2017-04-26 ENCOUNTER — Ambulatory Visit (INDEPENDENT_AMBULATORY_CARE_PROVIDER_SITE_OTHER): Payer: Managed Care, Other (non HMO) | Admitting: Orthopaedic Surgery

## 2017-05-02 ENCOUNTER — Ambulatory Visit
Admission: RE | Admit: 2017-05-02 | Discharge: 2017-05-02 | Disposition: A | Discharge status: Home / Self Care | Payer: Managed Care, Other (non HMO) | Source: Ambulatory Visit | Attending: Neurosurgery | Admitting: Neurosurgery

## 2017-05-02 DIAGNOSIS — M4316 Spondylolisthesis, lumbar region: Secondary | ICD-10-CM

## 2017-11-03 IMAGING — MR MR LUMBAR SPINE W/O CM
4 of 5 series · 18 of 48 positions shown · non-contrast
Comparison: Lumbar spine CT 03/20/2017

CLINICAL DATA: Low back pain with bilateral hip and leg pain for
2.5 months. Prior fall. Remote surgery.

EXAM:
MRI LUMBAR SPINE WITHOUT CONTRAST
TECHNIQUE: Multiplanar, multisequence MR imaging of the lumbar spine was
performed. No intravenous contrast was administered.

[Series 6: T2 · sagittal · 4.0mm · 0.73mm/px · 6 of 15 slices shown (1 of 2)]
[im 1/15]
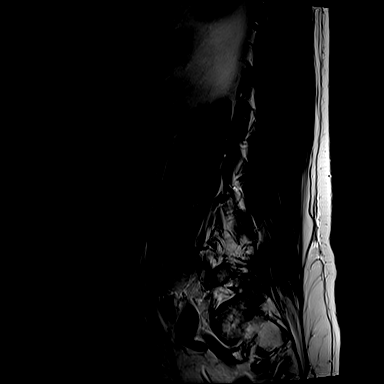
[im 3/15]
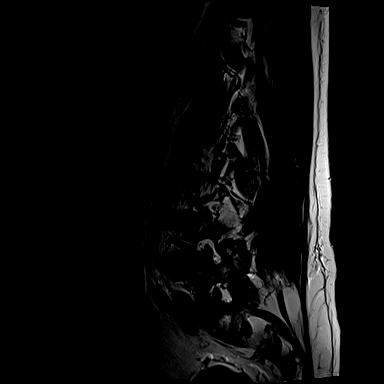
[im 6/15]
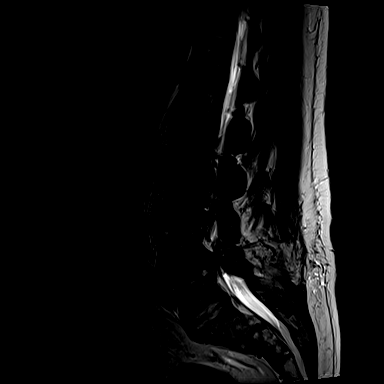
[im 9/15]
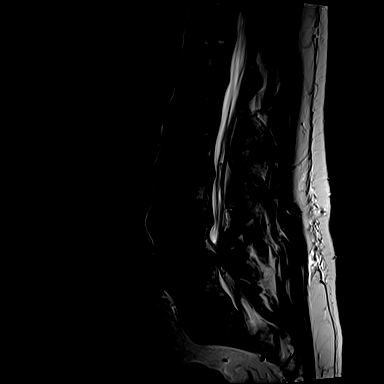
[im 12/15]
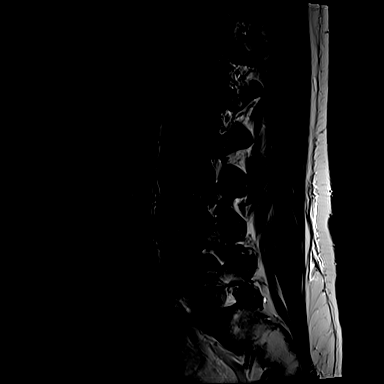
[im 15/15]
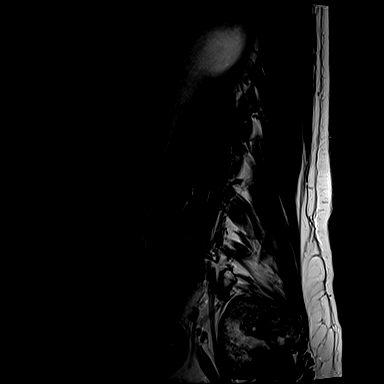

[Series 7: T1 · sagittal · 4.0mm · 0.73mm/px · 3 of 15 slices shown (1 of 2)]
[im 1/15]
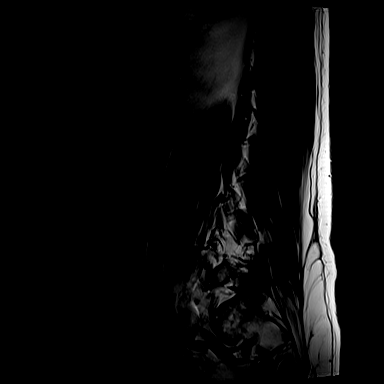
[im 8/15]
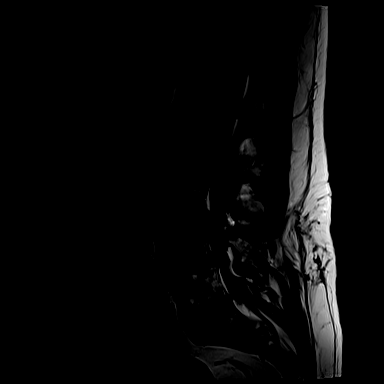
[im 15/15]
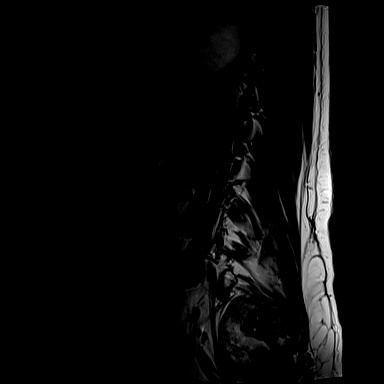

[Series 13: T2 · axial · 4.0mm · 0.28mm/px · z∈[-136,+58]mm · 6 of 43 slices shown (2 of 2)]
[im 3/43]
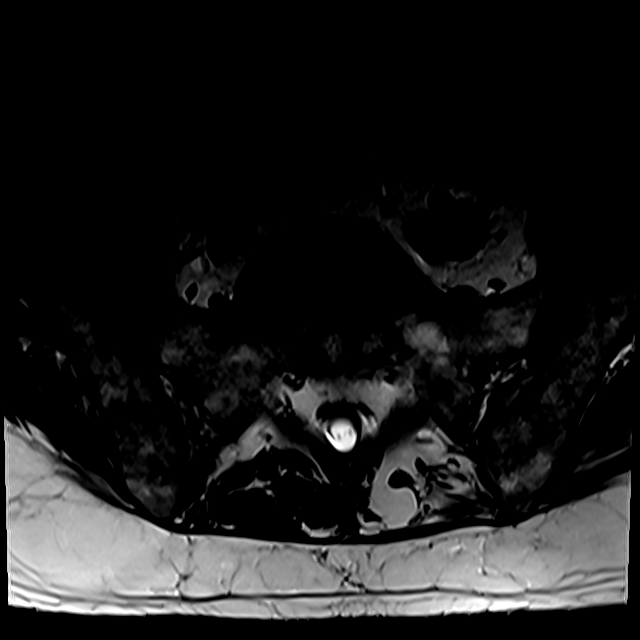
[im 6/43]
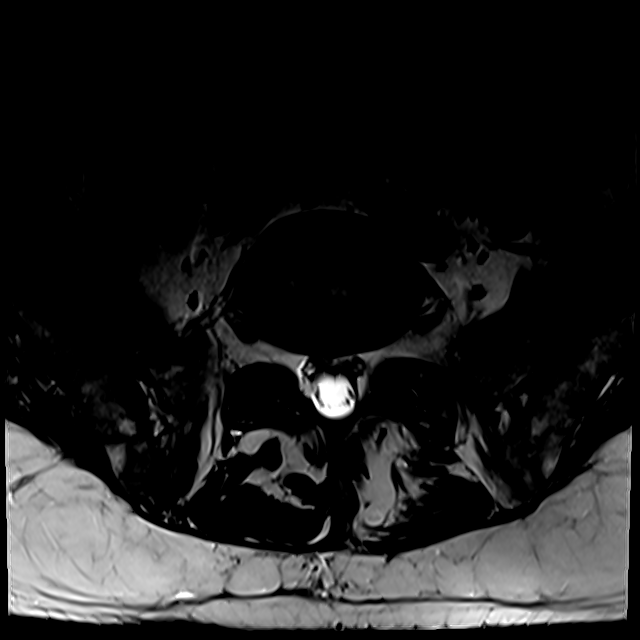
[im 9/43]
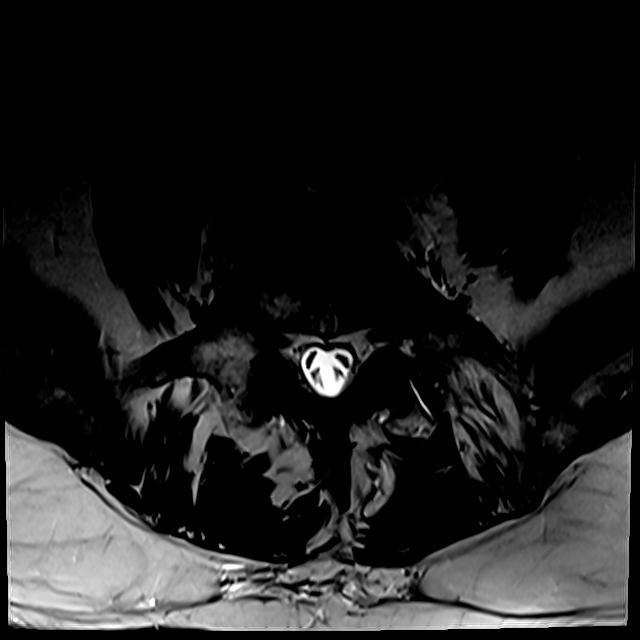
[im 15/43]
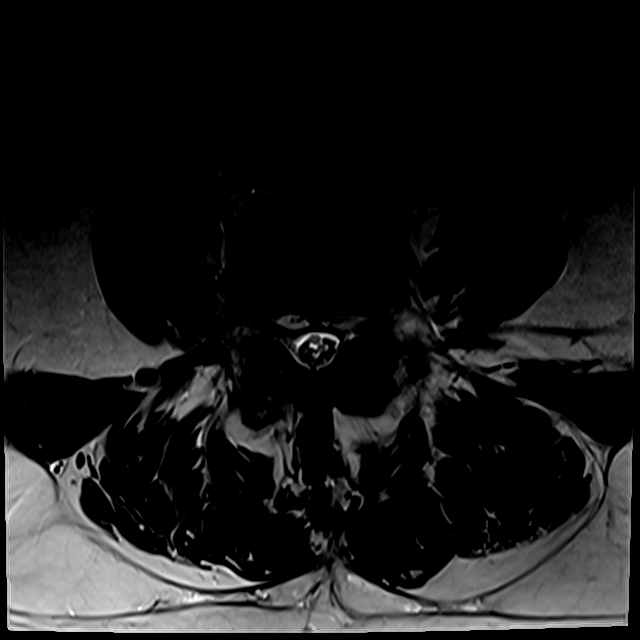
[im 23/43]
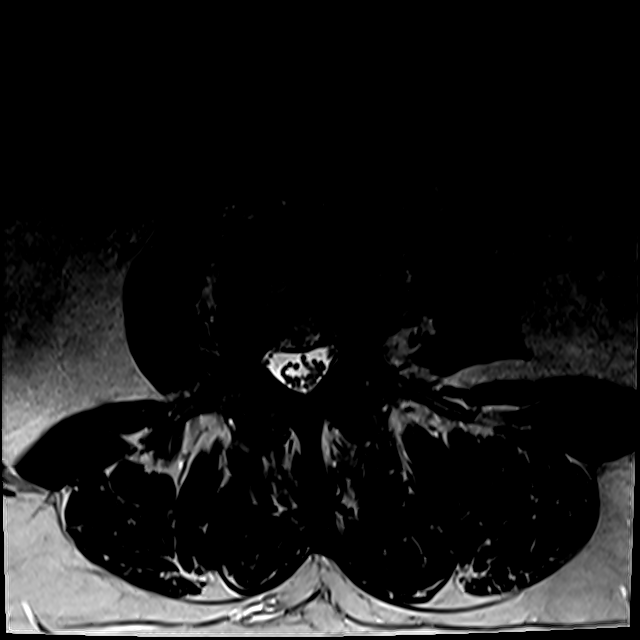
[im 37/43]
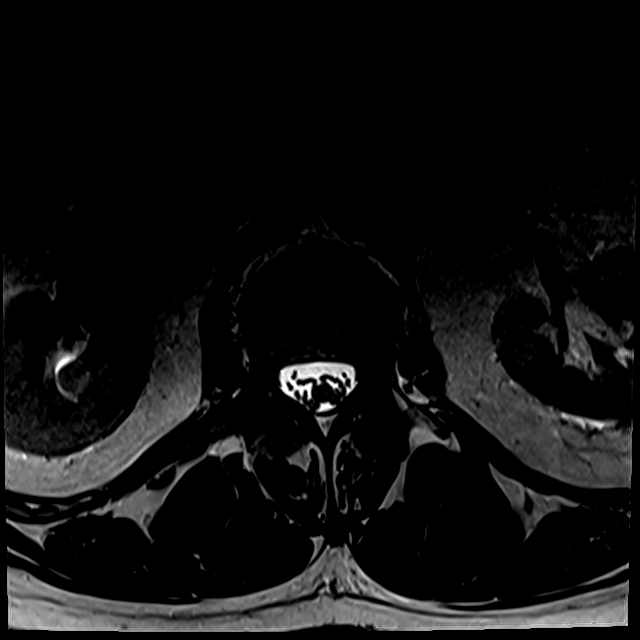

[Series 100: T1 · axial · 4.0mm · 0.28mm/px · z∈[-121,+58]mm · 3 of 43 slices shown (2 of 2)]
[im 6/43]
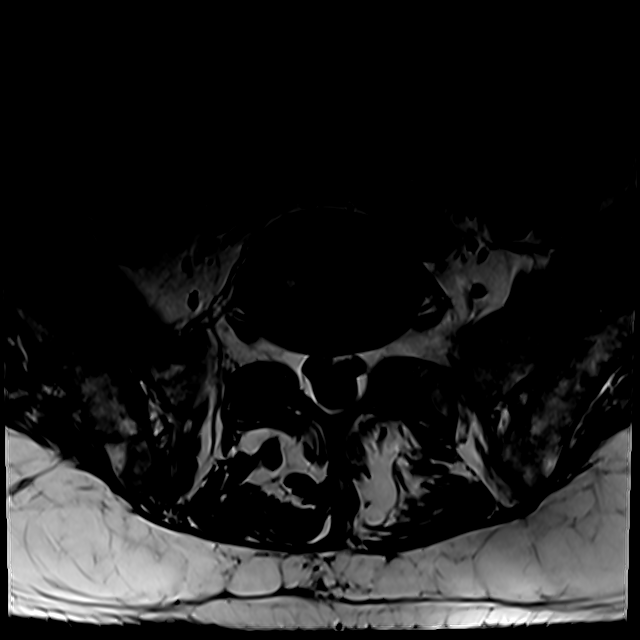
[im 23/43]
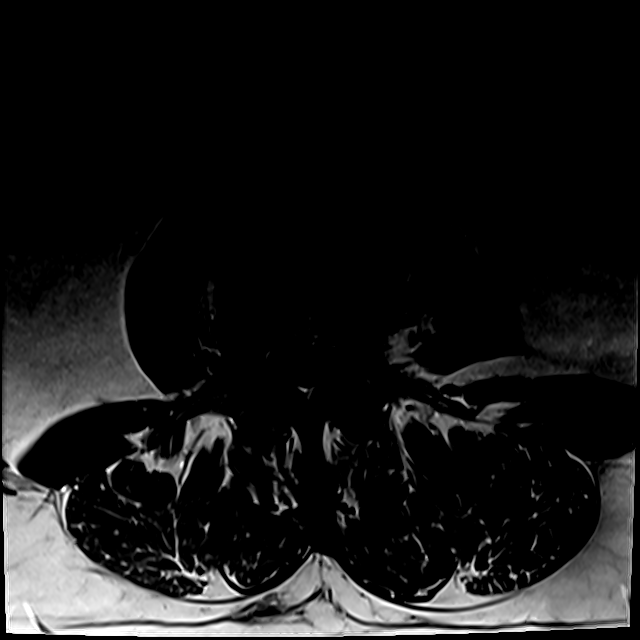
[im 37/43]
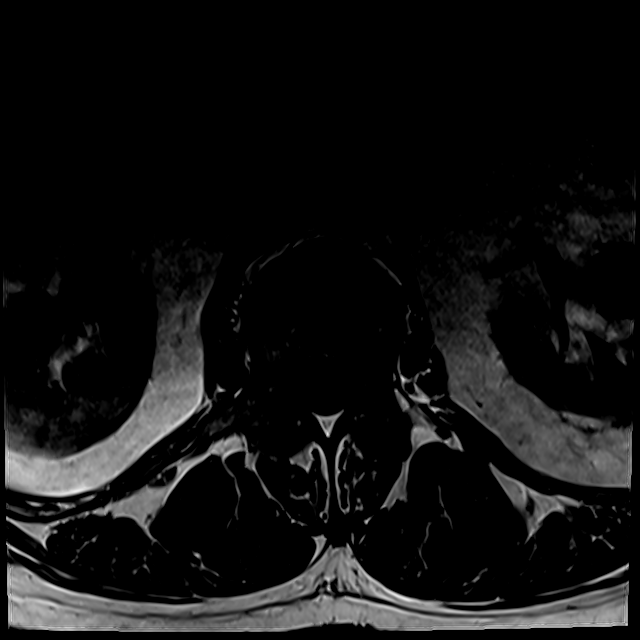

[18 of 48 positions shown; findings below may reference images not displayed]

FINDINGS: Segmentation:  Standard.

Alignment: Mild lumbar dextroscoliosis. Unchanged 4 mm
anterolisthesis of L4 on L5 related to underlying facet arthrosis.

Vertebrae: No fracture. Heterogeneous bone marrow signal diffusely
without suspicious focal lesion. Degenerative endplate changes at
L3-4, predominantly sclerotic on CT.

Conus medullaris: Extends to the L1 level and appears normal.

Paraspinal and other soft tissues: Postsurgical changes with left
lower lumbar paraspinal muscle atrophy. No fluid collection.

Disc levels:

T12-L1: Negative.

L1-2:  Normal disc.  Minimal facet arthrosis without stenosis.

L2-3: Disc desiccation and mild-to-moderate disc space narrowing.
Circumferential disc bulging slightly greater to the left and mild
facet and ligamentum flavum hypertrophy result in mild spinal
stenosis, mild left lateral recess stenosis, and mild left neural
foraminal stenosis, unchanged.

L3-4: Disc desiccation and severe disc space narrowing. Prior left
laminectomy again noted. Suspected mild fibrosis in the left lateral
recess. No significant spinal stenosis. Circumferential disc bulging
greater to the left and moderate facet hypertrophy result in
moderate left neural foraminal stenosis, unchanged.

L4-5: Disc desiccation and mild disc space narrowing. Listhesis with
bulging uncovered disc, ligamentum flavum thickening, and severe
facet arthrosis result in severe spinal stenosis, bilateral lateral
recess stenosis, and moderate right and mild left neural foraminal
stenosis. Bilateral L5 nerve root impingement. Small bilateral facet
joint effusions.

L5-S1: Normal disc. Minimal right and moderate left facet arthrosis
without stenosis.
IMPRESSION: 1. Severe L4-5 facet arthrosis with grade 1 anterolisthesis and
severe spinal stenosis.
2. Advanced disc degeneration with postoperative changes at L3-4.
Moderate left neural foraminal stenosis without significant spinal
stenosis.
3. Mild spinal stenosis at L2-3.

## 2019-09-18 ENCOUNTER — Other Ambulatory Visit: Payer: Self-pay

## 2019-09-18 ENCOUNTER — Ambulatory Visit: Payer: Managed Care, Other (non HMO) | Admitting: Orthopaedic Surgery

## 2019-09-18 ENCOUNTER — Encounter: Payer: Self-pay | Admitting: Orthopaedic Surgery

## 2019-09-18 DIAGNOSIS — M792 Neuralgia and neuritis, unspecified: Secondary | ICD-10-CM

## 2019-09-18 DIAGNOSIS — R202 Paresthesia of skin: Secondary | ICD-10-CM | POA: Diagnosis not present

## 2019-09-18 DIAGNOSIS — R200 Anesthesia of skin: Secondary | ICD-10-CM

## 2019-09-18 MED ORDER — METHYLPREDNISOLONE 4 MG PO TABS: ORAL_TABLET | ORAL | 0 refills | Status: DC

## 2019-09-18 MED ORDER — GABAPENTIN 300 MG PO CAPS: 300.0000 mg | ORAL_CAPSULE | Freq: Every day | ORAL | 1 refills | Status: DC
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
## 2019-09-19 ENCOUNTER — Other Ambulatory Visit: Payer: Self-pay

## 2019-09-19 DIAGNOSIS — M792 Neuralgia and neuritis, unspecified: Secondary | ICD-10-CM

## 2019-09-19 DIAGNOSIS — R200 Anesthesia of skin: Secondary | ICD-10-CM

## 2019-10-09 ENCOUNTER — Ambulatory Visit: Payer: Managed Care, Other (non HMO) | Admitting: Orthopaedic Surgery

## 2019-10-17 ENCOUNTER — Encounter: Payer: Managed Care, Other (non HMO) | Admitting: Physical Medicine and Rehabilitation

## 2019-11-05 ENCOUNTER — Other Ambulatory Visit: Payer: Self-pay

## 2019-11-05 ENCOUNTER — Ambulatory Visit: Payer: Managed Care, Other (non HMO) | Admitting: Physical Medicine and Rehabilitation

## 2019-11-05 DIAGNOSIS — R202 Paresthesia of skin: Secondary | ICD-10-CM | POA: Diagnosis not present

## 2019-11-05 NOTE — Progress Notes (Signed)
Numeric Pain Rating Scale and Functional Assessment Average Pain 7   In the last MONTH (on 0-10 scale) has pain interfered with the following?  1. General activity like being  able to carry out your everyday physical activities such as walking, climbing stairs, carrying groceries, or moving a chair?  Rating(7)   

## 2019-11-07 NOTE — Procedures (Signed)
EMG & NCV Findings: Evaluation of the left median motor nerve showed prolonged distal onset latency (5.7 ms), reduced amplitude (4.8 mV), and decreased conduction velocity (Elbow-Wrist, 44 m/s).  The left median (across palm) sensory nerve showed prolonged distal peak latency (Wrist, 4.8 ms) and prolonged distal peak latency (Palm, 2.1 ms).  All remaining nerves (as indicated in the following tables) were within normal limits.    All examined muscles (as indicated in the following table) showed no evidence of electrical instability.    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   There is no significant electrodiagnostic evidence of any other focal nerve entrapment, brachial plexopathy or cervical radiculopathy.  As you know, this particular electrodiagnostic study cannot rule out chemical radiculitis or sensory only radiculopathy.  Recommendations: 1.  Follow-up with referring physician. 2.  Continue current management of symptoms. 3.  Suggest surgical evaluation. If still felt to be some component of cervical radiculitis then suggest MRI of the cervical spine.  ___________________________ Wonda Olds Board Certified, American Board of Physical Medicine and Rehabilitation    Nerve Conduction Studies Anti Sensory Summary Table   Stim Site NR Peak (ms) Norm Peak (ms) P-T Amp (V) Norm P-T Amp Site1 Site2 Delta-P (ms) Dist (cm) Vel (m/s) Norm Vel (m/s)  Left Median Acr Palm Anti Sensory (2nd Digit)  30.3C  Wrist    *4.8 <3.6 17.9 >10 Wrist Palm 2.7 0.0    Palm    *2.1 <2.0 20.1         Left Radial Anti Sensory (Base 1st Digit)  30.8C  Wrist    2.5 <3.1 22.6  Wrist Base 1st Digit 2.5 0.0    Left Ulnar Anti Sensory (5th Digit)  30.5C  Wrist    3.5 <3.7 19.2 >15.0 Wrist 5th Digit 3.5 14.0 40 >38   Motor Summary  Table   Stim Site NR Onset (ms) Norm Onset (ms) O-P Amp (mV) Norm O-P Amp Site1 Site2 Delta-0 (ms) Dist (cm) Vel (m/s) Norm Vel (m/s)  Left Median Motor (Abd Poll Brev)  30.8C  Wrist    *5.7 <4.2 *4.8 >5 Elbow Wrist 4.7 20.5 *44 >50  Elbow    10.4  3.8         Left Ulnar Motor (Abd Dig Min)  30.8C  Wrist    3.1 <4.2 7.7 >3 B Elbow Wrist 3.6 20.5 57 >53  B Elbow    6.7  7.4  A Elbow B Elbow 1.5 10.0 67 >53  A Elbow    8.2  7.9          EMG   Side Muscle Nerve Root Ins Act Fibs Psw Amp Dur Poly Recrt Int Fraser Din Comment  Left Abd Poll Brev Median C8-T1 Nml Nml Nml Nml Nml 0 Nml Nml   Left 1stDorInt Ulnar C8-T1 Nml Nml Nml Nml Nml 0 Nml Nml   Left PronatorTeres Median C6-7 Nml Nml Nml Nml Nml 0 Nml Nml   Left Biceps Musculocut C5-6 Nml Nml Nml Nml Nml 0 Nml Nml   Left Deltoid Axillary C5-6 Nml Nml Nml Nml Nml 0 Nml Nml     Nerve Conduction Studies Anti Sensory Left/Right Comparison   Stim Site L Lat (ms) R Lat (ms) L-R Lat (ms) L Amp (V) R Amp (V) L-R Amp (%) Site1 Site2 L Vel (m/s) R Vel (m/s) L-R Vel (  m/s)  Median Acr Palm Anti Sensory (2nd Digit)  30.3C  Wrist *4.8   17.9   Wrist Palm     Palm *2.1   20.1         Radial Anti Sensory (Base 1st Digit)  30.8C  Wrist 2.5   22.6   Wrist Base 1st Digit     Ulnar Anti Sensory (5th Digit)  30.5C  Wrist 3.5   19.2   Wrist 5th Digit 40     Motor Left/Right Comparison   Stim Site L Lat (ms) R Lat (ms) L-R Lat (ms) L Amp (mV) R Amp (mV) L-R Amp (%) Site1 Site2 L Vel (m/s) R Vel (m/s) L-R Vel (m/s)  Median Motor (Abd Poll Brev)  30.8C  Wrist *5.7   *4.8   Elbow Wrist *44    Elbow 10.4   3.8         Ulnar Motor (Abd Dig Min)  30.8C  Wrist 3.1   7.7   B Elbow Wrist 57    B Elbow 6.7   7.4   A Elbow B Elbow 67    A Elbow 8.2   7.9            Waveforms:
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
## 2019-11-13 ENCOUNTER — Ambulatory Visit: Payer: Managed Care, Other (non HMO) | Admitting: Orthopaedic Surgery

## 2019-11-13 ENCOUNTER — Other Ambulatory Visit: Payer: Self-pay

## 2019-11-13 ENCOUNTER — Encounter: Payer: Self-pay | Admitting: Orthopaedic Surgery

## 2019-11-13 DIAGNOSIS — R200 Anesthesia of skin: Secondary | ICD-10-CM | POA: Diagnosis not present

## 2019-11-13 DIAGNOSIS — R202 Paresthesia of skin: Secondary | ICD-10-CM

## 2019-11-13 DIAGNOSIS — M792 Neuralgia and neuritis, unspecified: Secondary | ICD-10-CM

## 2019-11-13 DIAGNOSIS — G5602 Carpal tunnel syndrome, left upper limb: Secondary | ICD-10-CM | POA: Diagnosis not present

## 2019-11-13 NOTE — Progress Notes (Signed)
Mr. Rising comes in today to go over nerve conduction studies of his left upper extremity.  He has been having hand pain with numbness and tingling for some time now.  He does a lot of work with maintenance.  On exam he does have some atrophy of his muscles of his left hand.  He does have a weak grip and pinch strength.  He has a positive Phalen Phalen's exam.  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  I did give him the complete studies showing his carpal tunnel syndrome.  I explained in detail our recommendation for surgery.  I described what the transverse carpal ligament involves in terms of anatomy.  I described in detail the risk and benefits of surgery and what to expect from an intraoperative and postoperative course.  All questions and concerns were answered and addressed.  He is interested in having this scheduled.  We will work on scheduling this and see him back in 2 weeks postoperative for suture removal.

## 2019-12-05 ENCOUNTER — Telehealth: Payer: Self-pay | Admitting: Orthopaedic Surgery

## 2019-12-05 ENCOUNTER — Other Ambulatory Visit: Payer: Self-pay | Admitting: Orthopaedic Surgery

## 2019-12-05 DIAGNOSIS — G5602 Carpal tunnel syndrome, left upper limb: Secondary | ICD-10-CM | POA: Diagnosis not present

## 2019-12-05 MED ORDER — TRAMADOL HCL 50 MG PO TABS: 100.0000 mg | ORAL_TABLET | Freq: Four times a day (QID) | ORAL | 0 refills | Status: DC | PRN

## 2019-12-05 MED ORDER — ONDANSETRON 4 MG PO TBDP: 4.0000 mg | ORAL_TABLET | Freq: Three times a day (TID) | ORAL | 0 refills | Status: DC | PRN

## 2019-12-05 NOTE — Telephone Encounter (Signed)
Pt called in said he was suppose to have 2 prescriptions sent to the pharmacy for him and he is [facility] [place] [address]   [phone]

## 2019-12-05 NOTE — Telephone Encounter (Signed)
Patient's wife Crystal called and stated patient had surgery and pain medication Tramadol was called into the wrong pharmacy.  Should [facility] [place].  Please call patient for he in a lot of pain

## 2019-12-05 NOTE — Telephone Encounter (Signed)
Called into pharmacy

## 2019-12-10 ENCOUNTER — Telehealth: Payer: Self-pay | Admitting: Orthopaedic Surgery

## 2019-12-10 NOTE — Telephone Encounter (Signed)
Any restrictions?

## 2019-12-10 NOTE — Telephone Encounter (Signed)
Patient called asked if a note faxed to his employer stating he can return to work and any restrictions. The fax# is [phone]  Attn: Talmage Nap Ph# is [phone]. The number to contact patient is [phone]

## 2019-12-10 NOTE — Telephone Encounter (Signed)
Ok for return to work without restrictions...unless he specifies any restrictions

## 2019-12-11 NOTE — Telephone Encounter (Signed)
Faxed to provided number  

## 2019-12-19 ENCOUNTER — Encounter: Payer: Self-pay | Admitting: Orthopaedic Surgery

## 2019-12-19 ENCOUNTER — Other Ambulatory Visit: Payer: Self-pay

## 2019-12-19 ENCOUNTER — Ambulatory Visit (INDEPENDENT_AMBULATORY_CARE_PROVIDER_SITE_OTHER): Payer: Managed Care, Other (non HMO) | Admitting: Orthopaedic Surgery

## 2019-12-19 DIAGNOSIS — Z9889 Other specified postprocedural states: Secondary | ICD-10-CM

## 2019-12-19 NOTE — Progress Notes (Signed)
The patient comes in at 2 weeks status post a left open carpal tunnel release.  He says he is doing well and is only tender at the incision site but he reports a lot of his pain is gone.  On exam we did remove the sutures.  Steri-Strips were placed.  Certainly this may dehisce but overall looks good.  He is moving his fingers and thumb well.  I did place a dressing back on his hand and wrist.  He needs to leave this on for the next 24 hours and he can remove the dressing and get the Steri-Strips wet in the shower daily.  If he has any wound issues he will let us know.  He can try any types of dressing changes as needed once the Steri-Strips fall off.  We will see him back in 4 weeks to see how he is doing overall.

## 2020-01-16 ENCOUNTER — Ambulatory Visit (INDEPENDENT_AMBULATORY_CARE_PROVIDER_SITE_OTHER): Payer: Managed Care, Other (non HMO) | Admitting: Orthopaedic Surgery

## 2020-01-16 ENCOUNTER — Encounter: Payer: Self-pay | Admitting: Orthopaedic Surgery

## 2020-01-16 ENCOUNTER — Other Ambulatory Visit: Payer: Self-pay

## 2020-01-16 DIAGNOSIS — Z9889 Other specified postprocedural states: Secondary | ICD-10-CM

## 2020-01-16 NOTE — Progress Notes (Signed)
The patient comes in today now about 6 weeks status post a left open carpal tunnel release.  He said he is doing okay and there are some tenderness at the incision site but overall he is gotten much better than what it was before surgery and just after surgery.  On exam his incision looks good.  It is a healing appropriately.  He has improved grip and pinch strength from his last visit as well.  He understands that this can take 6 months to a year to recover based on the severity of his carpal tunnel syndrome and his age.  He is healing appropriately.  I recommended some Voltaren gel for his incision in terms of inflammatory pain and gave him reassurance that he should still recover.  He does have carpal tunnel syndrome involving the left upper extremity and if he gets bad enough he needs to come see [place].  At this point I can release him to follow-up as needed unless there are other issues.

## 2020-06-25 ENCOUNTER — Telehealth: Payer: Self-pay

## 2020-06-25 ENCOUNTER — Ambulatory Visit: Payer: Managed Care, Other (non HMO) | Admitting: Cardiovascular Disease

## 2020-06-25 ENCOUNTER — Other Ambulatory Visit: Payer: Self-pay

## 2020-06-25 ENCOUNTER — Encounter: Payer: Self-pay | Admitting: Cardiovascular Disease

## 2020-06-25 DIAGNOSIS — R079 Chest pain, unspecified: Secondary | ICD-10-CM | POA: Diagnosis not present

## 2020-06-25 DIAGNOSIS — K219 Gastro-esophageal reflux disease without esophagitis: Secondary | ICD-10-CM

## 2020-06-25 DIAGNOSIS — F419 Anxiety disorder, unspecified: Secondary | ICD-10-CM | POA: Diagnosis not present

## 2020-06-25 DIAGNOSIS — I10 Essential (primary) hypertension: Secondary | ICD-10-CM | POA: Diagnosis not present

## 2020-06-25 MED ORDER — PANTOPRAZOLE SODIUM 40 MG PO TBEC: 40.0000 mg | DELAYED_RELEASE_TABLET | Freq: Every day | ORAL | 2 refills | Status: DC

## 2020-06-25 NOTE — Telephone Encounter (Signed)
[id number], pt needs to be scheduled for an echo

## 2020-06-25 NOTE — Patient Instructions (Addendum)
Medication Instructions:  BEGIN TAKING PROTONIX 40MG  DAILY *If you need a refill on your cardiac medications before your next appointment, please call your pharmacy*    Testing/Procedures: CALCIUM SCORE- SEE BELOW  Your physician has requested that you have an echocardiogram. Echocardiography is a painless test that uses sound waves to create images of your heart. It provides your doctor with information about the size and shape of your heart and how well your heart's chambers and valves are working. This procedure takes approximately one hour. There are no restrictions for this procedure.     Follow-Up: At [facility], you and your health needs are our priority.  As part of our continuing mission to provide you with exceptional heart care, we have created designated Provider Care Teams.  These Care Teams include your primary Cardiologist (physician) and Advanced Practice Providers (APPs -  Physician Assistants and Nurse Practitioners) who all work together to provide you with the care you need, when you need it.  We recommend signing up for the patient portal called "MyChart".  Sign up information is provided on this After Visit Summary.  MyChart is used to connect with patients for Virtual Visits (Telemedicine).  Patients are able to view lab/test results, encounter notes, upcoming appointments, etc.  Non-urgent messages can be sent to your provider as well.   To learn more about what you can do with MyChart, go to [email or web address].    Your next appointment:   2 month(s)  The format for your next appointment:   In Person  Provider:   Shelva Majestic, MD   Other Instructions DR.KELLY  has ordered a CT coronary calcium score. This test is done at [address]. This is $150 out of pocket.   Coronary CalciumScan A coronary calcium scan is an imaging test used to look for deposits of calcium and other fatty materials (plaques) in the inner lining of the blood  vessels of the heart (coronary arteries). These deposits of calcium and plaques can partly clog and narrow the coronary arteries without producing any symptoms or warning signs. This puts a person at risk for a heart attack. This test can detect these deposits before symptoms develop. Tell a health care provider about:  Any allergies you have.  All medicines you are taking, including vitamins, herbs, eye drops, creams, and over-the-counter medicines.  Any problems you or family members have had with anesthetic medicines.  Any blood disorders you have.  Any surgeries you have had.  Any medical conditions you have.  Whether you are pregnant or may be pregnant. What are the risks? Generally, this is a safe procedure. However, problems may occur, including:  Harm to a pregnant woman and her unborn baby. This test involves the use of radiation. Radiation exposure can be dangerous to a pregnant woman and her unborn baby. If you are pregnant, you generally should not have this procedure done.  Slight increase in the risk of cancer. This is because of the radiation involved in the test. What happens before the procedure? No preparation is needed for this procedure. What happens during the procedure?  You will undress and remove any jewelry around your neck or chest.  You will put on a hospital gown.  Sticky electrodes will be placed on your chest. The electrodes will be connected to an electrocardiogram (ECG) machine to record a tracing of the electrical activity of your heart.  A CT scanner will take pictures of your heart. During this time, you  will be asked to lie still and hold your breath for 2-3 seconds while a picture of your heart is being taken. The procedure may vary among health care providers and hospitals. What happens after the procedure?  You can get dressed.  You can return to your normal activities.  It is up to you to get the results of your test. Ask your health  care provider, or the department that is doing the test, when your results will be ready. Summary  A coronary calcium scan is an imaging test used to look for deposits of calcium and other fatty materials (plaques) in the inner lining of the blood vessels of the heart (coronary arteries).  Generally, this is a safe procedure. Tell your health care provider if you are pregnant or may be pregnant.  No preparation is needed for this procedure.  A CT scanner will take pictures of your heart.  You can return to your normal activities after the scan is done. This information is not intended to replace advice given to you by your health care provider. Make sure you discuss any questions you have with your health care provider. Document Released: 03/24/2008 Document Revised: 08/15/2016 Document Reviewed: 08/15/2016 Elsevier Interactive Patient Education  2017 [employer].
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
## 2020-06-26 NOTE — Telephone Encounter (Signed)
Echo scheduled for 07/09/20

## 2020-06-30 ENCOUNTER — Encounter: Payer: Self-pay | Admitting: Cardiovascular Disease

## 2020-07-09 ENCOUNTER — Ambulatory Visit (HOSPITAL_COMMUNITY): Payer: Managed Care, Other (non HMO) | Attending: Cardiology

## 2020-07-09 ENCOUNTER — Ambulatory Visit (INDEPENDENT_AMBULATORY_CARE_PROVIDER_SITE_OTHER)
Admission: RE | Admit: 2020-07-09 | Discharge: 2020-07-09 | Disposition: A | Discharge status: Home / Self Care | Payer: Self-pay | Source: Ambulatory Visit | Attending: Cardiovascular Disease | Admitting: Cardiovascular Disease

## 2020-07-09 ENCOUNTER — Other Ambulatory Visit: Payer: Self-pay

## 2020-07-09 DIAGNOSIS — R079 Chest pain, unspecified: Secondary | ICD-10-CM | POA: Diagnosis not present

## 2020-07-09 LAB — ECHOCARDIOGRAM COMPLETE
Area-P 1/2: 4.21 cm2
S' Lateral: 2.5 cm

## 2020-07-09 MED ORDER — PERFLUTREN LIPID MICROSPHERE
1.0000 mL | INTRAVENOUS | Status: AC | PRN
  Administered 2020-07-09: 2 mL via INTRAVENOUS

## 2020-08-24 ENCOUNTER — Ambulatory Visit: Payer: Managed Care, Other (non HMO) | Admitting: Orthopaedic Surgery

## 2020-08-24 ENCOUNTER — Encounter: Payer: Self-pay | Admitting: Orthopaedic Surgery

## 2020-08-24 VITALS — Ht 69.0 in | Wt 198.0 lb

## 2020-08-24 DIAGNOSIS — G5601 Carpal tunnel syndrome, right upper limb: Secondary | ICD-10-CM | POA: Diagnosis not present

## 2020-08-24 DIAGNOSIS — Z9889 Other specified postprocedural states: Secondary | ICD-10-CM

## 2020-08-24 NOTE — Addendum Note (Signed)
Addended by: Robyne Peers on: 08/24/2020 03:48 PM   Modules accepted: Orders

## 2020-08-24 NOTE — Progress Notes (Signed)
Patient is well-known to me.  He is just over 6 months out from a left open carpal tunnel release.  He has done well on that side.  He also has severe carpal tunnel syndrome on the right side.  He gets numbness and pain in his index finger all the way to his little finger.  He has pain at rest but usually it occurs when he is doing his work.  He is right-hand dominant.  This is definitely work-related in terms of the repetitive gripping and high-impact work that he has to do on a daily basis and this is contributed to his carpal tunnel syndrome over a long period of time.  On examination he does have improved grip and pinch strength on his left operative side and his incisions healed nicely.  There is still some pain associated with this given the severity of his carpal tunnel syndrome he understands that it can take up to a year to get optimal results.  Examination of his right hand does show weak pinch and grip strength as well as numbness in the median nerve distribution.  There is a positive Phalen's and Tinel's exam on this side.  At this point we are recommending the right open carpal tunnel release.  We described in detail with the surgery involves and having had it before he is fully aware of the risk and benefits of surgery.  Again, I do feel that this is work-related from repetitive heavy work over a long period of time which is contributed to his carpal tunnel syndrome.  We will work on getting him set up for surgery in the near future for a right open carpal tunnel release.  All question concerns were answered and addressed.

## 2020-08-26 ENCOUNTER — Telehealth: Payer: Self-pay | Admitting: Physical Medicine and Rehabilitation

## 2020-08-26 NOTE — Telephone Encounter (Signed)
Scheduled for 11/19 at 1100.

## 2020-08-26 NOTE — Telephone Encounter (Signed)
Pt called stating he was offered a nerve conductivity test in order to have surgery and he originally turned it down but he would like to go through with it now. Pt would like a CB to schedule this please  [phone]

## 2020-08-28 ENCOUNTER — Ambulatory Visit: Payer: Managed Care, Other (non HMO) | Admitting: Physical Medicine and Rehabilitation

## 2020-08-28 ENCOUNTER — Encounter: Payer: Self-pay | Admitting: Physical Medicine and Rehabilitation

## 2020-08-28 ENCOUNTER — Other Ambulatory Visit: Payer: Self-pay

## 2020-08-28 ENCOUNTER — Telehealth: Payer: Self-pay

## 2020-08-28 DIAGNOSIS — R202 Paresthesia of skin: Secondary | ICD-10-CM

## 2020-08-28 NOTE — Procedures (Signed)
EMG & NCV Findings: Evaluation of the right median motor nerve showed prolonged distal onset latency (6.4 ms), reduced amplitude (3.5 mV), and decreased conduction velocity (Elbow-Wrist, 45 m/s).  The right median (across palm) sensory nerve showed prolonged distal peak latency (Wrist, 5.7 ms) and prolonged distal peak latency (Palm, 2.5 ms).  All remaining nerves (as indicated in the following tables) were within normal limits.    All examined muscles (as indicated in the following table) showed no evidence of electrical instability.    Impression: The above electrodiagnostic study is ABNORMAL and reveals evidence of a severe right median nerve entrapment at the wrist (carpal tunnel syndrome) affecting sensory and motor components.   There is no significant electrodiagnostic evidence of any other focal nerve entrapment, brachial plexopathy or cervical radiculopathy.   Recommendations: 1.  Follow-up with referring physician. 2.  Continue current management of symptoms. 3.  Suggest surgical evaluation.  ___________________________ Laurence Spates FAAPMR Board Certified, American Board of Physical Medicine and Rehabilitation    Nerve Conduction Studies Anti Sensory Summary Table   Stim Site NR Peak (ms) Norm Peak (ms) P-T Amp (V) Norm P-T Amp Site1 Site2 Delta-P (ms) Dist (cm) Vel (m/s) Norm Vel (m/s)  Right Median Acr Palm Anti Sensory (2nd Digit)  30.5C  Wrist    *5.7 <3.6 13.1 >10 Wrist Palm 3.2 0.0    Palm    *2.5 <2.0 2.5         Right Radial Anti Sensory (Base 1st Digit)  30.8C  Wrist    2.3 <3.1 33.5  Wrist Base 1st Digit 2.3 0.0    Right Ulnar Anti Sensory (5th Digit)  30.9C  Wrist    3.1 <3.7 24.8 >15.0 Wrist 5th Digit 3.1 14.0 45 >38   Motor Summary Table   Stim Site NR Onset (ms) Norm Onset (ms) O-P Amp (mV) Norm O-P Amp Site1 Site2 Delta-0 (ms) Dist (cm) Vel (m/s) Norm Vel (m/s)  Right Median Motor (Abd Poll Brev)  31C  Wrist    *6.4 <4.2 *3.5 >5 Elbow Wrist 5.1 23.0  *45 >50  Elbow    11.5  3.2         Right Ulnar Motor (Abd Dig Min)  31C  Wrist    3.0 <4.2 7.8 >3 B Elbow Wrist 3.5 21.5 61 >53  B Elbow    6.5  9.8  A Elbow B Elbow 1.2 11.0 92 >53  A Elbow    7.7  9.6          EMG   Side Muscle Nerve Root Ins Act Fibs Psw Amp Dur Poly Recrt Int Fraser Din Comment  Right Abd Poll Brev Median C8-T1 Nml Nml Nml Nml Nml 0 Nml Nml   Right 1stDorInt Ulnar C8-T1 Nml Nml Nml Nml Nml 0 Nml Nml     Nerve Conduction Studies Anti Sensory Left/Right Comparison   Stim Site L Lat (ms) R Lat (ms) L-R Lat (ms) L Amp (V) R Amp (V) L-R Amp (%) Site1 Site2 L Vel (m/s) R Vel (m/s) L-R Vel (m/s)  Median Acr Palm Anti Sensory (2nd Digit)  30.5C  Wrist  *5.7   13.1  Wrist Palm     Palm  *2.5   2.5        Radial Anti Sensory (Base 1st Digit)  30.8C  Wrist  2.3   33.5  Wrist Base 1st Digit     Ulnar Anti Sensory (5th Digit)  30.9C  Wrist  3.1   24.8  Wrist 5th Digit  45    Motor Left/Right Comparison   Stim Site L Lat (ms) R Lat (ms) L-R Lat (ms) L Amp (mV) R Amp (mV) L-R Amp (%) Site1 Site2 L Vel (m/s) R Vel (m/s) L-R Vel (m/s)  Median Motor (Abd Poll Brev)  31C  Wrist  *6.4   *3.5  Elbow Wrist  *45   Elbow  11.5   3.2        Ulnar Motor (Abd Dig Min)  31C  Wrist  3.0   7.8  B Elbow Wrist  61   B Elbow  6.5   9.8  A Elbow B Elbow  92   A Elbow  7.7   9.6           Waveforms:

## 2020-08-28 NOTE — Telephone Encounter (Signed)
Please call patient with NCS results.  Thanks!

## 2020-08-28 NOTE — Progress Notes (Signed)
Numbness in right hand. Pain is worse in mornings and wakes him up.  Right hand dominant No lotion per patient Numeric Pain Rating Scale and Functional Assessment Average Pain 6   In the last MONTH (on 0-10 scale) has pain interfered with the following?  1. General activity like being  able to carry out your everyday physical activities such as walking, climbing stairs, carrying groceries, or moving a chair?  Rating(10)

## 2020-08-31 NOTE — Telephone Encounter (Signed)
Please print out his last office visit note as well as the most recent EMG study notes.  He would like to come by later today and pick these up so he can get them to his work.

## 2020-08-31 NOTE — Progress Notes (Signed)
Richard Hines - 64 y.o. male MRN [id number]  Date of birth: 07/14/1956  Office Visit Note: Visit Date: 08/28/2020 PCP: Leonides Sake, MD Referred by: Leonides Sake, MD  Subjective: Chief Complaint  Patient presents with  . Right Hand - Numbness   HPI:  Richard Hines is a 64 y.o. male who comes in today at the request of Dr. Jean Rosenthal for electrodiagnostic study of the Right upper extremities.  Patient is Right hand dominant.  Patient reports chronic worsening severe right hand pain with numbness and tingling mostly in the radial digits.  He rates his pain as 6 out of 10 and does feel like he has some weakness in the hand.  Its common for him to have nocturnal complaints and be awakened at night with his hand pain and numbness.  He is status post left carpal tunnel release by Dr. Ninfa Linden.  This was performed in the end of February of this year.  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  Right hand at the time was not problematic and was not tested.  He denies any specific radicular type complaints.   ROS Otherwise per HPI.  Assessment & Plan: Visit Diagnoses:  1. Paresthesia of skin     Plan: Impression: The above electrodiagnostic study is ABNORMAL and reveals evidence of a severe right median nerve entrapment at the wrist (carpal tunnel syndrome) affecting sensory and motor components.   There is no significant electrodiagnostic evidence of any other focal nerve entrapment, brachial plexopathy or cervical radiculopathy.   Recommendations: 1.  Follow-up with referring physician. 2.  Continue current management of symptoms. 3.  Suggest surgical evaluation.  Meds & Orders: No orders of the defined types were placed in this encounter.   Orders Placed This Encounter  Procedures  . NCV with EMG (electromyography)    Follow-up: Return for Jean Rosenthal, MD.   Procedures: No procedures performed  EMG & NCV  Findings: Evaluation of the right median motor nerve showed prolonged distal onset latency (6.4 ms), reduced amplitude (3.5 mV), and decreased conduction velocity (Elbow-Wrist, 45 m/s).  The right median (across palm) sensory nerve showed prolonged distal peak latency (Wrist, 5.7 ms) and prolonged distal peak latency (Palm, 2.5 ms).  All remaining nerves (as indicated in the following tables) were within normal limits.    All examined muscles (as indicated in the following table) showed no evidence of electrical instability.    Impression: The above electrodiagnostic study is ABNORMAL and reveals evidence of a severe right median nerve entrapment at the wrist (carpal tunnel syndrome) affecting sensory and motor components.   There is no significant electrodiagnostic evidence of any other focal nerve entrapment, brachial plexopathy or cervical radiculopathy.   Recommendations: 1.  Follow-up with referring physician. 2.  Continue current management of symptoms. 3.  Suggest surgical evaluation.  ___________________________ Laurence Spates FAAPMR Board Certified, American Board of Physical Medicine and Rehabilitation    Nerve Conduction Studies Anti Sensory Summary Table   Stim Site NR Peak (ms) Norm Peak (ms) P-T Amp (V) Norm P-T Amp Site1 Site2 Delta-P (ms) Dist (cm) Vel (m/s) Norm Vel (m/s)  Right Median Acr Palm Anti Sensory (2nd Digit)  30.5C  Wrist    *5.7 <3.6 13.1 >10 Wrist Palm 3.2 0.0    Palm    *2.5 <2.0 2.5         Right Radial Anti Sensory (Base 1st Digit)  30.8C  Wrist    2.3 <3.1  33.5  Wrist Base 1st Digit 2.3 0.0    Right Ulnar Anti Sensory (5th Digit)  30.9C  Wrist    3.1 <3.7 24.8 >15.0 Wrist 5th Digit 3.1 14.0 45 >38   Motor Summary Table   Stim Site NR Onset (ms) Norm Onset (ms) O-P Amp (mV) Norm O-P Amp Site1 Site2 Delta-0 (ms) Dist (cm) Vel (m/s) Norm Vel (m/s)  Right Median Motor (Abd Poll Brev)  31C  Wrist    *6.4 <4.2 *3.5 >5 Elbow Wrist 5.1 23.0 *45 >50    Elbow    11.5  3.2         Right Ulnar Motor (Abd Dig Min)  31C  Wrist    3.0 <4.2 7.8 >3 B Elbow Wrist 3.5 21.5 61 >53  B Elbow    6.5  9.8  A Elbow B Elbow 1.2 11.0 92 >53  A Elbow    7.7  9.6          EMG   Side Muscle Nerve Root Ins Act Fibs Psw Amp Dur Poly Recrt Int Fraser Din Comment  Right Abd Poll Brev Median C8-T1 Nml Nml Nml Nml Nml 0 Nml Nml   Right 1stDorInt Ulnar C8-T1 Nml Nml Nml Nml Nml 0 Nml Nml     Nerve Conduction Studies Anti Sensory Left/Right Comparison   Stim Site L Lat (ms) R Lat (ms) L-R Lat (ms) L Amp (V) R Amp (V) L-R Amp (%) Site1 Site2 L Vel (m/s) R Vel (m/s) L-R Vel (m/s)  Median Acr Palm Anti Sensory (2nd Digit)  30.5C  Wrist  *5.7   13.1  Wrist Palm     Palm  *2.5   2.5        Radial Anti Sensory (Base 1st Digit)  30.8C  Wrist  2.3   33.5  Wrist Base 1st Digit     Ulnar Anti Sensory (5th Digit)  30.9C  Wrist  3.1   24.8  Wrist 5th Digit  45    Motor Left/Right Comparison   Stim Site L Lat (ms) R Lat (ms) L-R Lat (ms) L Amp (mV) R Amp (mV) L-R Amp (%) Site1 Site2 L Vel (m/s) R Vel (m/s) L-R Vel (m/s)  Median Motor (Abd Poll Brev)  31C  Wrist  *6.4   *3.5  Elbow Wrist  *45   Elbow  11.5   3.2        Ulnar Motor (Abd Dig Min)  31C  Wrist  3.0   7.8  B Elbow Wrist  61   B Elbow  6.5   9.8  A Elbow B Elbow  92   A Elbow  7.7   9.6           Waveforms:             Clinical History: 11/05/2019 Electrodiagnostic Study Impression: The above electrodiagnostic study is ABNORMAL and reveals evidence of a moderate to severe left median nerve entrapment at the wrist (carpal tunnel syndrome) affecting sensory and motor components.   There is no significant electrodiagnostic evidence of any other focal nerve entrapment, brachial plexopathy or cervical radiculopathy.  As you know, this particular electrodiagnostic study cannot rule out chemical radiculitis or sensory only radiculopathy.  Recommendations: 1.  Follow-up with referring  physician. 2.  Continue current management of symptoms. 3.  Suggest surgical evaluation. If still felt to be some component of cervical radiculitis then suggest MRI of the cervical spine.     Objective:  VS:  HT:    WT:   BMI:     BP:   HR: bpm  TEMP: ( )  RESP:  Physical Exam Musculoskeletal:        General: No tenderness.     Comments: Inspection reveals well-healed left carpal tunnel surgical scar on the left with some flattening of the bilateral APB but no atrophy of the bilateral FDI or hand intrinsics. There is no swelling, color changes, allodynia or dystrophic changes. There is 5 out of 5 strength in the bilateral wrist extension, finger abduction and long finger flexion.  There is impaired sensation to light touch in the right median nerve distribution.  There is a negative Hoffmann's test bilaterally.  Skin:    General: Skin is warm and dry.     Findings: No erythema or rash.  Neurological:     General: No focal deficit present.     Mental Status: He is alert and oriented to person, place, and time.     Sensory: No sensory deficit.     Motor: No weakness or abnormal muscle tone.     Coordination: Coordination normal.     Gait: Gait normal.  Psychiatric:        Mood and Affect: Mood normal.        Behavior: Behavior normal.        Thought Content: Thought content normal.      Imaging: No results found.

## 2020-08-31 NOTE — Telephone Encounter (Signed)
See if you can get a preliminary report or something for me on this patient so I can call him today to let him know what the nerve study showed.  Thanks

## 2020-08-31 NOTE — Telephone Encounter (Signed)
Its there

## 2020-08-31 NOTE — Telephone Encounter (Signed)
Printed off and put at front desk

## 2020-09-16 ENCOUNTER — Ambulatory Visit: Payer: Managed Care, Other (non HMO) | Admitting: Cardiovascular Disease

## 2020-09-17 ENCOUNTER — Other Ambulatory Visit: Payer: Self-pay | Admitting: Cardiovascular Disease

## 2020-09-25 ENCOUNTER — Telehealth: Payer: Self-pay | Admitting: Orthopaedic Surgery

## 2020-09-25 NOTE — Telephone Encounter (Addendum)
Pt called stating he tested positive for covid on 09/15/20 and he was told he didn't have to R/S his surgery he just needed to make Dr.Blackman aware; so this is his FYI.  [phone]

## 2020-09-27 ENCOUNTER — Encounter: Payer: Self-pay | Admitting: Nurse Practitioner

## 2020-09-27 ENCOUNTER — Other Ambulatory Visit (HOSPITAL_COMMUNITY): Payer: Self-pay | Admitting: Nurse Practitioner

## 2020-09-27 DIAGNOSIS — U071 COVID-19: Secondary | ICD-10-CM

## 2020-09-27 NOTE — Progress Notes (Signed)
I connected by phone with Ples Specter on 09/27/2020 at 10:29 AM to discuss the potential use of a treatment for mild to moderate COVID-19 viral infection in non-hospitalized patients.  This patient is a 64 y.o. male that meets the FDA criteria for Emergency Use Authorization of bamlanivimab/etesevimab, casirivimab\imdevimab, or sotrovimab  Has a (+) direct SARS-CoV-2 viral test result  Has mild or moderate COVID-19   Is ? 64 years of age and weighs ? 40 kg  Is NOT hospitalized due to COVID-19  Is NOT requiring oxygen therapy or requiring an increase in baseline oxygen flow rate due to COVID-19  Is within 10 days of symptom onset  Has at least one of the high risk factor(s) for progression to severe COVID-19 and/or hospitalization as defined in EUA.  Specific high risk criteria : Cardiovascular disease or hypertension and Other high risk medical condition per CDC:  SVI   Onset 12/14.    I have spoken and communicated the following to the patient or parent/caregiver:  1. FDA has authorized the emergency use of bamlanivimab/etesevimab, casirivimab\imdevimab, or sotrovimab for the treatment of mild to moderate COVID-19 in adults and pediatric patients with positive results of direct SARS-CoV-2 viral testing who are 1 years of age and older weighing at least 40 kg, and who are at high risk for progressing to severe COVID-19 and/or hospitalization.  2. The significant known and potential risks and benefits of bamlanivimab/etesevimab, casirivimab\imdevimab, or sotrovimab, and the extent to which such potential risks and benefits are unknown.  3. Information on available alternative treatments and the risks and benefits of those alternatives, including clinical trials.  4. Patients treated with bamlanivimab/etesevimab, casirivimab\imdevimab, or sotrovimab should continue to self-isolate and use infection control measures (e.g., wear mask, isolate, social distance, avoid sharing  personal items, clean and disinfect high touch surfaces, and frequent handwashing) according to CDC guidelines.   5. The patient or parent/caregiver has the option to accept or refuse bamlanivimab/etesevimab, casirivimab\imdevimab, or sotrovimab.  After reviewing this information with the patient, the patient has agreed to receive one of the available covid 19 monoclonal antibodies and will be provided an appropriate fact sheet prior to infusion.Beckey Rutter, [place], AGNP-C [phone] ([place])

## 2020-09-28 ENCOUNTER — Ambulatory Visit (HOSPITAL_COMMUNITY)
Admission: RE | Admit: 2020-09-28 | Discharge: 2020-09-28 | Disposition: A | Discharge status: Home / Self Care | Payer: Managed Care, Other (non HMO) | Source: Ambulatory Visit | Attending: Pulmonary Disease | Admitting: Pulmonary Disease

## 2020-09-28 DIAGNOSIS — U071 COVID-19: Secondary | ICD-10-CM | POA: Insufficient documentation

## 2020-09-28 MED ORDER — ALBUTEROL SULFATE HFA 108 (90 BASE) MCG/ACT IN AERS: 2.0000 | INHALATION_SPRAY | Freq: Once | RESPIRATORY_TRACT | Status: DC | PRN

## 2020-09-28 MED ORDER — EPINEPHRINE 0.3 MG/0.3ML IJ SOAJ: 0.3000 mg | Freq: Once | INTRAMUSCULAR | Status: DC | PRN

## 2020-09-28 MED ORDER — SODIUM CHLORIDE 0.9 % IV SOLN: Freq: Once | INTRAVENOUS | Status: AC

## 2020-09-28 MED ORDER — FAMOTIDINE IN NACL 20-0.9 MG/50ML-% IV SOLN: 20.0000 mg | Freq: Once | INTRAVENOUS | Status: DC | PRN

## 2020-09-28 MED ORDER — SODIUM CHLORIDE 0.9 % IV SOLN: INTRAVENOUS | Status: DC | PRN

## 2020-09-28 MED ORDER — METHYLPREDNISOLONE SODIUM SUCC 125 MG IJ SOLR: 125.0000 mg | Freq: Once | INTRAMUSCULAR | Status: DC | PRN

## 2020-09-28 MED ORDER — DIPHENHYDRAMINE HCL 50 MG/ML IJ SOLN: 50.0000 mg | Freq: Once | INTRAMUSCULAR | Status: DC | PRN

## 2020-09-28 NOTE — Progress Notes (Signed)
  Diagnosis: COVID-19  Physician:Dr. Patrick Wright  Procedure: [facility] Med: bamlanivimab\etesevimab infusion - Provided patient with bamlanimivab\etesevimab fact sheet for patients, parents and caregivers prior to infusion.  Complications: No immediate complications noted.  Discharge: Discharged home   Demosthenes Virnig 09/28/2020   

## 2020-09-28 NOTE — Discharge Instructions (Signed)
10 Things You Can Do to Manage Your COVID-19 Symptoms at Home If you have possible or confirmed COVID-19: 1. Stay home from work and school. And stay away from other public places. If you must go out, avoid using any kind of public transportation, ridesharing, or taxis. 2. Monitor your symptoms carefully. If your symptoms get worse, call your healthcare provider immediately. 3. Get rest and stay hydrated. 4. If you have a medical appointment, call the healthcare provider ahead of time and tell them that you have or may have COVID-19. 5. For medical emergencies, call 911 and notify the dispatch personnel that you have or may have COVID-19. 6. Cover your cough and sneezes with a tissue or use the inside of your elbow. 7. Wash your hands often with soap and water for at least 20 seconds or clean your hands with an alcohol-based hand sanitizer that contains at least 60% alcohol. 8. As much as possible, stay in a specific room and away from other people in your home. Also, you should use a separate bathroom, if available. If you need to be around other people in or outside of the home, wear a mask. 9. Avoid sharing personal items with other people in your household, like dishes, towels, and bedding. 10. Clean all surfaces that are touched often, like counters, tabletops, and doorknobs. Use household cleaning sprays or wipes according to the label instructions. cdc.gov/coronavirus 04/10/2019 This information is not intended to replace advice given to you by your health care provider. Make sure you discuss any questions you have with your health care provider. Document Revised: 09/12/2019 Document Reviewed: 09/12/2019 Elsevier Patient Education  2020 Elsevier Inc. What types of side effects do monoclonal antibody drugs cause?  Common side effects  In general, the more common side effects caused by monoclonal antibody drugs include: . Allergic reactions, such as hives or itching . Flu-like signs and  symptoms, including chills, fatigue, fever, and muscle aches and pains . Nausea, vomiting . Diarrhea . Skin rashes . Low blood pressure   The CDC is recommending patients who receive monoclonal antibody treatments wait at least 90 days before being vaccinated.  Currently, there are no data on the safety and efficacy of mRNA COVID-19 vaccines in persons who received monoclonal antibodies or convalescent plasma as part of COVID-19 treatment. Based on the estimated half-life of such therapies as well as evidence suggesting that reinfection is uncommon in the 90 days after initial infection, vaccination should be deferred for at least 90 days, as a precautionary measure until additional information becomes available, to avoid interference of the antibody treatment with vaccine-induced immune responses. If you have any questions or concerns after the infusion please call the Advanced Practice Provider on call at [phone]. This number is ONLY intended for your use regarding questions or concerns about the infusion post-treatment side-effects.  Please do not provide this number to others for use. For return to work notes please contact your primary care provider.   If someone you know is interested in receiving treatment please have them call the COVID hotline at [phone].   

## 2020-09-28 NOTE — Progress Notes (Signed)
Patient reviewed Fact Sheet for Patients, Parents, and Caregivers for Emergency Use Authorization (EUA) of bamlanivimab and etesevimab for the Treatment of Coronavirus. Patient also reviewed and is agreeable to the estimated cost of treatment. Patient is agreeable to proceed.   

## 2020-09-29 ENCOUNTER — Other Ambulatory Visit (HOSPITAL_COMMUNITY): Payer: Self-pay | Admitting: Nurse Practitioner

## 2020-11-16 ENCOUNTER — Telehealth: Payer: Self-pay | Admitting: Adult Health

## 2020-11-16 ENCOUNTER — Emergency Department (HOSPITAL_COMMUNITY)
Admission: EM | Admit: 2020-11-16 | Discharge: 2020-11-16 | Disposition: A | Discharge status: Home / Self Care | Payer: Managed Care, Other (non HMO) | Attending: Emergency Medicine | Admitting: Emergency Medicine

## 2020-11-16 ENCOUNTER — Encounter (HOSPITAL_COMMUNITY): Payer: Self-pay | Admitting: Emergency Medicine

## 2020-11-16 ENCOUNTER — Emergency Department (HOSPITAL_COMMUNITY): Payer: Managed Care, Other (non HMO)

## 2020-11-16 DIAGNOSIS — Z5321 Procedure and treatment not carried out due to patient leaving prior to being seen by health care provider: Secondary | ICD-10-CM | POA: Diagnosis not present

## 2020-11-16 DIAGNOSIS — R079 Chest pain, unspecified: Secondary | ICD-10-CM | POA: Insufficient documentation

## 2020-11-16 LAB — BASIC METABOLIC PANEL
Anion gap: 14 (ref 5–15)
BUN: 22 mg/dL (ref 8–23)
CO2: 24 mmol/L (ref 22–32)
Calcium: 9.1 mg/dL (ref 8.9–10.3)
Chloride: 105 mmol/L (ref 98–111)
Creatinine, Ser: 0.97 mg/dL (ref 0.61–1.24)
GFR, Estimated: >60 mL/min (ref >60)
Glucose, Bld: 80 mg/dL (ref 70–99)
Potassium: 3.6 mmol/L (ref 3.5–5.1)
Sodium: 143 mmol/L (ref 135–145)

## 2020-11-16 LAB — CBC
HCT: 42.9 % (ref 39.0–52.0)
Hemoglobin: 14.2 g/dL (ref 13.0–17.0)
MCH: 28.5 pg (ref 26.0–34.0)
MCHC: 33.1 g/dL (ref 30.0–36.0)
MCV: 86.1 fL (ref 80.0–100.0)
Platelets: 349 10*3/uL (ref 150–400)
RBC: 4.98 MIL/uL (ref 4.22–5.81)
RDW: 13 % (ref 11.5–15.5)
WBC: 7.9 10*3/uL (ref 4.0–10.5)
nRBC: 0 % (ref 0.0–0.2)

## 2020-11-16 LAB — TROPONIN I (HIGH SENSITIVITY): Troponin I (High Sensitivity): 4 ng/L (ref <18)

## 2020-11-16 NOTE — Telephone Encounter (Signed)
Spoke with patient. For the last 2-3 months or longer has had continued chest pain in the left side of his chest under his clavicle. He reports it never went away from September, It is sharpe and aching. No shortness of breath, nausea, dizziness or sweating. No BP or HR available. Nothing makes the chest pain worse, sometimes rest makes it better. Advised patient to call 911 due to ongoing current left sided aching / sharpe chest pain and to keep appointment with Jory Sims, DNP on 11/20/20.

## 2020-11-16 NOTE — Telephone Encounter (Signed)
Vu requested an appointment via Patient schedule for "pain in chest". He has been scheduled for 11/20/20 at 2:15 PM with Jory Sims due to this. Patient has been notified this message has been sent and a nurse will be contacting him to discuss his symptoms further. Please advise.

## 2020-11-16 NOTE — ED Triage Notes (Signed)
Pt reports chest pain off and on for the past few months, started having worsening central chest pain at work today and his boss called ems who came out and evaluated him. States that they said his EKG showed an extra beat. Pt denies sob. States he was worked [place] with no definitive diagnosis.

## 2020-11-16 NOTE — ED Notes (Signed)
Pt left with family at this time. Following up with pcp

## 2020-11-16 NOTE — Telephone Encounter (Signed)
[name] is calling to inform [name] he did call EMT and is currently admitted. He states he did not ride with EMT, but got a ride to the hospital and the Doctor was entering the room as the call ended. Please advise.

## 2020-11-17 ENCOUNTER — Telehealth: Payer: Self-pay | Admitting: Cardiovascular Disease

## 2020-11-17 NOTE — Telephone Encounter (Signed)
Pt c/o of Chest Pain: STAT if CP now or developed within 24 hours  1. Are you having CP right now? No.  2. Are you experiencing any other symptoms (ex. SOB, nausea, vomiting, sweating)? No.  3. How long have you been experiencing CP? A few months.  4. Is your CP continuous or coming and going? Coming and going.  5. Have you taken Nitroglycerin? No.   Patient is calling stating that he is experiencing chest pain. Please advise. ?

## 2020-11-17 NOTE — Telephone Encounter (Signed)
Spoke to patient he stated he has been having pain in left shoulder blade off and on for the past 2 months.Stated he went to ED last night was told he was ok.Advised to keep appointment already scheduled with Jory Sims NP 11/20/20 at 2:15 pm.

## 2020-11-20 ENCOUNTER — Ambulatory Visit: Payer: Managed Care, Other (non HMO) | Admitting: Adult Health

## 2020-11-24 ENCOUNTER — Telehealth: Payer: Self-pay

## 2020-11-24 NOTE — Telephone Encounter (Signed)
Patient called he stated he  wants to proceed with carpel tunnel surgery he stated his insurance company would like a call back regarding any info.  that may be needed call [phone]

## 2020-11-26 NOTE — Telephone Encounter (Signed)
I called patient to discuss scheduling.  He now wants to hold off on surgery.  He will call me back when ready.

## 2022-01-25 ENCOUNTER — Telehealth: Payer: Self-pay | Admitting: Orthopaedic Surgery

## 2022-01-25 NOTE — Telephone Encounter (Signed)
Pt asking about restarting the process for surg on his other hand. Pt originally was sch but had covid and was held off from getting surg. Pt unsure if he will need to be seen back in office prior to getting sch'd or if he can speak with the surg sch'ler and get on the list. The best call back number [phone].  ?

## 2022-02-11 NOTE — Telephone Encounter (Signed)
I called patient and advised that I can get him scheduled for sometime in June or after on a Thursday.  He will check with his work and see when would be a good time and call me back to discuss scheduling. ?

## 2022-02-23 ENCOUNTER — Ambulatory Visit: Payer: Managed Care, Other (non HMO) | Admitting: Orthopaedic Surgery

## 2022-02-23 ENCOUNTER — Ambulatory Visit (INDEPENDENT_AMBULATORY_CARE_PROVIDER_SITE_OTHER): Payer: Managed Care, Other (non HMO)

## 2022-02-23 ENCOUNTER — Telehealth: Payer: Self-pay

## 2022-02-23 DIAGNOSIS — M722 Plantar fascial fibromatosis: Secondary | ICD-10-CM | POA: Diagnosis not present

## 2022-02-23 DIAGNOSIS — M79672 Pain in left foot: Secondary | ICD-10-CM | POA: Diagnosis not present

## 2022-02-23 MED ORDER — METHYLPREDNISOLONE ACETATE 40 MG/ML IJ SUSP
40.0000 mg | INTRAMUSCULAR | Status: AC | PRN
  Administered 2022-02-23: 40 mg

## 2022-02-23 MED ORDER — LIDOCAINE HCL 1 % IJ SOLN
1.0000 mL | INTRAMUSCULAR | Status: AC | PRN
  Administered 2022-02-23: 1 mL
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
## 2022-02-23 NOTE — Telephone Encounter (Signed)
Patient called stating that he is having left foot/heel pain that has been ongoing for months.  Stated that he can't stand on left foot.  Would like to know what he needs to do?  Cb# [id number].  Please advise.  Thank you. ?

## 2022-04-07 ENCOUNTER — Telehealth: Payer: Self-pay | Admitting: Physician Assistant

## 2022-04-07 ENCOUNTER — Encounter: Payer: Self-pay | Admitting: Physician Assistant

## 2022-04-07 ENCOUNTER — Ambulatory Visit (INDEPENDENT_AMBULATORY_CARE_PROVIDER_SITE_OTHER): Payer: Managed Care, Other (non HMO) | Admitting: Physician Assistant

## 2022-04-07 DIAGNOSIS — M722 Plantar fascial fibromatosis: Secondary | ICD-10-CM | POA: Diagnosis not present

## 2022-04-07 NOTE — Telephone Encounter (Signed)
Called patient advised he will need to get the work comp information prior to his appointment today. Patient said he will contact the [facility]  and have him call me back with the information needed.
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
## 2022-04-18 ENCOUNTER — Ambulatory Visit: Payer: Managed Care, Other (non HMO) | Admitting: Physician Assistant

## 2022-04-18 ENCOUNTER — Encounter: Payer: Self-pay | Admitting: Physician Assistant

## 2022-04-18 DIAGNOSIS — M722 Plantar fascial fibromatosis: Secondary | ICD-10-CM

## 2022-04-18 NOTE — Progress Notes (Signed)
  HPI: Mr. Bingley returns today for his left foot pain.  He states he is doing much better.  He is no longer having pain in the heel of his foot.  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  He has been wearing the cam walker boot most of the time but occasionally taking it off in the house and ambulating.  Review of systems see HPI otherwise negative  Physical exam: General well-developed well-nourished male in no acute distress. Left foot/ankle: No impending ulcers rashes skin lesions or erythema.  Nontender at the medial tubercle of the calcaneus.  Nontender along the Achilles and the Achilles insertion.  Positive foot drop.  Passively I can bring his foot to neutral.  Impression: Left plantar fasciitis  Plan: Discussed with him gastrocsoleus stretching.  He is activities as tolerated.  He is able to return to work tomorrow full duties.  He will follow-up with [place] as needed questions were encouraged and answered.

## 2022-04-21 ENCOUNTER — Encounter: Payer: Managed Care, Other (non HMO) | Admitting: Orthopaedic Surgery

## 2022-08-25 ENCOUNTER — Ambulatory Visit: Payer: Managed Care, Other (non HMO) | Admitting: Physician Assistant

## 2022-08-25 ENCOUNTER — Encounter: Payer: Self-pay | Admitting: Physician Assistant

## 2022-08-25 DIAGNOSIS — M722 Plantar fascial fibromatosis: Secondary | ICD-10-CM

## 2022-08-25 NOTE — Progress Notes (Signed)
HPI: [name] comes in today again for left heel pain.  We last saw him in July.  He had a cortisone injection and was referred for plantar fasciitis and has not been placed in the left and his pain resolved.  However his pain came back about a month ago.  He states he is on his feet all day works on concrete.  He has a history of head injury and has a foot drop on the left.  Does note that he is on his feet a lot and by 2:00 each day he has severe heel pain.  He has heel pain whenever he first begins to ambulate.  He has been doing some gastroc stretching.  Review of systems: See HPI otherwise negative  Physical exam: General well-developed well-nourished male in no acute distress. Bilateral lower extremities tight gastroc on the left negative on the right.  Tenderness over the plantar fascia of the left heel knee.  Full dorsiflexion plantarflexion right ankle no dorsiflexion of the left ankle.  Nontender over the Achilles bilaterally Achilles is intact on the left.   Impression: Left plantar fasciitis Left foot drop Left tight gastroc  Plan: We will send him for shockwave therapy to his left heel.  If this fails to improve his pain along with the continued gastrocsoleus stretching along with the heel lifts that he was Hines today.  Then I will recommend that he follow-up with Richard Hines to discuss possible gastrocs slide.  Questions were encouraged and answered at length

## 2022-09-14 ENCOUNTER — Ambulatory Visit: Payer: Managed Care, Other (non HMO) | Admitting: Orthopedic Surgery

## 2022-12-15 ENCOUNTER — Encounter: Payer: Self-pay | Admitting: Radiology

## 2023-04-25 DIAGNOSIS — N529 Male erectile dysfunction, unspecified: Secondary | ICD-10-CM | POA: Diagnosis not present

## 2023-04-25 DIAGNOSIS — R6882 Decreased libido: Secondary | ICD-10-CM | POA: Diagnosis not present

## 2023-07-26 DIAGNOSIS — Z131 Encounter for screening for diabetes mellitus: Secondary | ICD-10-CM | POA: Diagnosis not present

## 2023-07-26 DIAGNOSIS — F330 Major depressive disorder, recurrent, mild: Secondary | ICD-10-CM | POA: Diagnosis not present

## 2023-07-26 DIAGNOSIS — Z0000 Encounter for general adult medical examination without abnormal findings: Secondary | ICD-10-CM | POA: Diagnosis not present

## 2023-07-26 DIAGNOSIS — Z79899 Other long term (current) drug therapy: Secondary | ICD-10-CM | POA: Diagnosis not present

## 2023-07-26 DIAGNOSIS — Z125 Encounter for screening for malignant neoplasm of prostate: Secondary | ICD-10-CM | POA: Diagnosis not present

## 2023-07-26 DIAGNOSIS — G4700 Insomnia, unspecified: Secondary | ICD-10-CM | POA: Diagnosis not present

## 2023-07-26 DIAGNOSIS — Z23 Encounter for immunization: Secondary | ICD-10-CM | POA: Diagnosis not present

## 2023-07-26 DIAGNOSIS — E7800 Pure hypercholesterolemia, unspecified: Secondary | ICD-10-CM | POA: Diagnosis not present

## 2023-07-26 DIAGNOSIS — R6882 Decreased libido: Secondary | ICD-10-CM | POA: Diagnosis not present

## 2023-07-26 DIAGNOSIS — I10 Essential (primary) hypertension: Secondary | ICD-10-CM | POA: Diagnosis not present

## 2023-07-26 DIAGNOSIS — Z9181 History of falling: Secondary | ICD-10-CM | POA: Diagnosis not present

## 2023-09-08 DIAGNOSIS — H524 Presbyopia: Secondary | ICD-10-CM | POA: Diagnosis not present

## 2023-09-26 DIAGNOSIS — L723 Sebaceous cyst: Secondary | ICD-10-CM | POA: Diagnosis not present

## 2024-01-30 DIAGNOSIS — I10 Essential (primary) hypertension: Secondary | ICD-10-CM | POA: Diagnosis not present

## 2024-01-30 DIAGNOSIS — Z79899 Other long term (current) drug therapy: Secondary | ICD-10-CM | POA: Diagnosis not present

## 2024-01-30 DIAGNOSIS — E7800 Pure hypercholesterolemia, unspecified: Secondary | ICD-10-CM | POA: Diagnosis not present

## 2024-01-30 DIAGNOSIS — G4700 Insomnia, unspecified: Secondary | ICD-10-CM | POA: Diagnosis not present

## 2024-01-30 DIAGNOSIS — Z139 Encounter for screening, unspecified: Secondary | ICD-10-CM | POA: Diagnosis not present

## 2024-04-09 DIAGNOSIS — M79602 Pain in left arm: Secondary | ICD-10-CM | POA: Diagnosis not present

## 2024-04-09 DIAGNOSIS — W19XXXA Unspecified fall, initial encounter: Secondary | ICD-10-CM | POA: Diagnosis not present

## 2024-04-19 DIAGNOSIS — M25512 Pain in left shoulder: Secondary | ICD-10-CM | POA: Diagnosis not present

## 2024-04-19 DIAGNOSIS — W19XXXA Unspecified fall, initial encounter: Secondary | ICD-10-CM | POA: Diagnosis not present

## 2024-04-19 DIAGNOSIS — M542 Cervicalgia: Secondary | ICD-10-CM | POA: Diagnosis not present

## 2024-08-02 DIAGNOSIS — G4700 Insomnia, unspecified: Secondary | ICD-10-CM | POA: Diagnosis not present

## 2024-08-02 DIAGNOSIS — Z23 Encounter for immunization: Secondary | ICD-10-CM | POA: Diagnosis not present

## 2024-08-02 DIAGNOSIS — E7800 Pure hypercholesterolemia, unspecified: Secondary | ICD-10-CM | POA: Diagnosis not present

## 2024-08-02 DIAGNOSIS — Z79899 Other long term (current) drug therapy: Secondary | ICD-10-CM | POA: Diagnosis not present

## 2024-08-02 DIAGNOSIS — I10 Essential (primary) hypertension: Secondary | ICD-10-CM | POA: Diagnosis not present
